# Patient Record
Sex: Male | Born: 1950 | Race: White | Hispanic: No | Marital: Married | Smoking: Former smoker
Health system: Southern US, Community
[De-identification: ages and names within clinical notes are randomized; demographics above are authoritative.]

## PROBLEM LIST (undated history)

## (undated) DIAGNOSIS — Z8619 Personal history of other infectious and parasitic diseases: Secondary | ICD-10-CM

## (undated) DIAGNOSIS — E785 Hyperlipidemia, unspecified: Secondary | ICD-10-CM

## (undated) DIAGNOSIS — I4891 Unspecified atrial fibrillation: Secondary | ICD-10-CM

## (undated) DIAGNOSIS — I10 Essential (primary) hypertension: Secondary | ICD-10-CM

## (undated) HISTORY — DX: Essential (primary) hypertension: I10

## (undated) HISTORY — PX: WISDOM TOOTH EXTRACTION: SHX21

## (undated) HISTORY — PX: HERNIA REPAIR: SHX51

## (undated) HISTORY — DX: Hyperlipidemia, unspecified: E78.5

## (undated) HISTORY — DX: Personal history of other infectious and parasitic diseases: Z86.19

## (undated) HISTORY — DX: Unspecified atrial fibrillation: I48.91

---

## 1979-06-13 HISTORY — PX: APPENDECTOMY: SHX54

## 2003-06-13 DIAGNOSIS — E785 Hyperlipidemia, unspecified: Secondary | ICD-10-CM | POA: Insufficient documentation

## 2006-06-12 HISTORY — PX: SPINAL FUSION: SHX223

## 2006-08-09 DIAGNOSIS — M799 Soft tissue disorder, unspecified: Secondary | ICD-10-CM | POA: Insufficient documentation

## 2006-08-31 ENCOUNTER — Inpatient Hospital Stay (HOSPITAL_COMMUNITY): Admission: RE | Admit: 2006-08-31 | Discharge: 2006-09-01 | Payer: Self-pay | Admitting: Neurosurgery

## 2007-06-20 IMAGING — CR DG CERVICAL SPINE 2 OR 3 VIEWS
1 series · 1 of 1 positions shown · non-contrast
Comparison: None

CLINICAL DATA: C4-C5, C5-C6 ACDF

CERVICAL SPINE - 2 VIEW

[view not recorded]
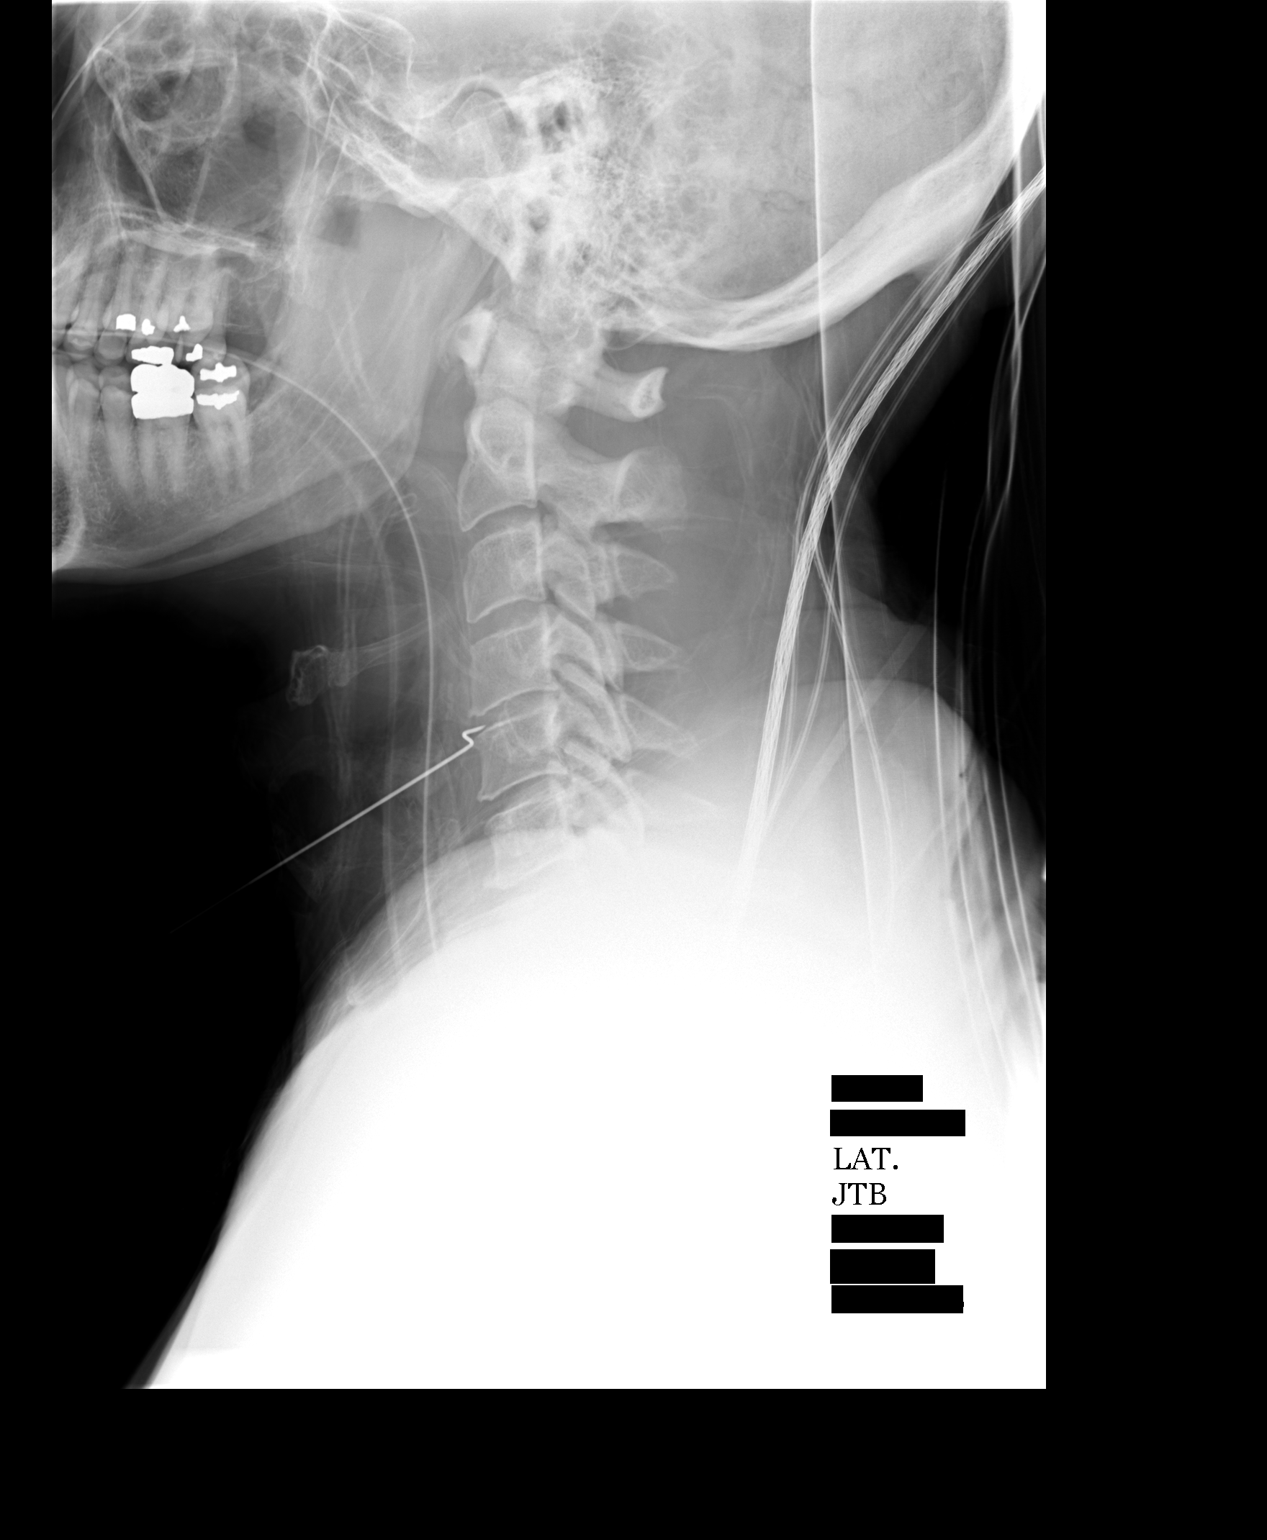

[1 of 1 positions shown; findings below may reference images not displayed]

FINDINGS: First intraoperative lateral film shows an anterior localizing
instrument directed at the C4-C5 level.

Second film shows the patient status post ACDF from C4-C6. Normal alignment.

IMPRESSION

ACDF C4-C6.

## 2008-04-27 DIAGNOSIS — I10 Essential (primary) hypertension: Secondary | ICD-10-CM | POA: Insufficient documentation

## 2008-07-22 ENCOUNTER — Ambulatory Visit: Payer: Self-pay | Admitting: Gastroenterology

## 2008-07-22 LAB — HM COLONOSCOPY

## 2009-10-07 DIAGNOSIS — R7303 Prediabetes: Secondary | ICD-10-CM | POA: Insufficient documentation

## 2009-10-10 DIAGNOSIS — N529 Male erectile dysfunction, unspecified: Secondary | ICD-10-CM | POA: Insufficient documentation

## 2013-10-06 DIAGNOSIS — E785 Hyperlipidemia, unspecified: Secondary | ICD-10-CM | POA: Diagnosis not present

## 2013-10-06 DIAGNOSIS — I10 Essential (primary) hypertension: Secondary | ICD-10-CM | POA: Diagnosis not present

## 2013-10-06 DIAGNOSIS — R7309 Other abnormal glucose: Secondary | ICD-10-CM | POA: Diagnosis not present

## 2013-10-06 DIAGNOSIS — J329 Chronic sinusitis, unspecified: Secondary | ICD-10-CM | POA: Diagnosis not present

## 2013-10-06 DIAGNOSIS — Z23 Encounter for immunization: Secondary | ICD-10-CM | POA: Diagnosis not present

## 2013-10-06 DIAGNOSIS — IMO0002 Reserved for concepts with insufficient information to code with codable children: Secondary | ICD-10-CM | POA: Diagnosis not present

## 2014-02-04 DIAGNOSIS — D235 Other benign neoplasm of skin of trunk: Secondary | ICD-10-CM | POA: Diagnosis not present

## 2014-02-04 DIAGNOSIS — Z85828 Personal history of other malignant neoplasm of skin: Secondary | ICD-10-CM | POA: Diagnosis not present

## 2014-02-04 DIAGNOSIS — L570 Actinic keratosis: Secondary | ICD-10-CM | POA: Diagnosis not present

## 2014-03-26 DIAGNOSIS — Z23 Encounter for immunization: Secondary | ICD-10-CM | POA: Diagnosis not present

## 2014-04-30 DIAGNOSIS — E785 Hyperlipidemia, unspecified: Secondary | ICD-10-CM | POA: Diagnosis not present

## 2014-04-30 DIAGNOSIS — I10 Essential (primary) hypertension: Secondary | ICD-10-CM | POA: Diagnosis not present

## 2014-04-30 DIAGNOSIS — R7309 Other abnormal glucose: Secondary | ICD-10-CM | POA: Diagnosis not present

## 2014-04-30 DIAGNOSIS — Z1389 Encounter for screening for other disorder: Secondary | ICD-10-CM | POA: Diagnosis not present

## 2014-04-30 DIAGNOSIS — Z0000 Encounter for general adult medical examination without abnormal findings: Secondary | ICD-10-CM | POA: Diagnosis not present

## 2014-04-30 DIAGNOSIS — Z125 Encounter for screening for malignant neoplasm of prostate: Secondary | ICD-10-CM | POA: Diagnosis not present

## 2014-05-11 DIAGNOSIS — R7309 Other abnormal glucose: Secondary | ICD-10-CM | POA: Diagnosis not present

## 2014-05-11 DIAGNOSIS — Z125 Encounter for screening for malignant neoplasm of prostate: Secondary | ICD-10-CM | POA: Diagnosis not present

## 2014-05-11 DIAGNOSIS — E785 Hyperlipidemia, unspecified: Secondary | ICD-10-CM | POA: Diagnosis not present

## 2014-05-11 DIAGNOSIS — I10 Essential (primary) hypertension: Secondary | ICD-10-CM | POA: Diagnosis not present

## 2014-05-11 LAB — BASIC METABOLIC PANEL
BUN: 18 mg/dL (ref 4–21)
CREATININE: 0.9 mg/dL (ref 0.6–1.3)
GLUCOSE: 137 mg/dL
Potassium: 4.7 mmol/L (ref 3.4–5.3)
Sodium: 141 mmol/L (ref 137–147)

## 2014-05-11 LAB — PSA: PSA: 2.3

## 2014-05-11 LAB — LIPID PANEL
CHOLESTEROL: 192 mg/dL (ref 0–200)
HDL: 50 mg/dL (ref 35–70)
LDL Cholesterol: 120 mg/dL
Triglycerides: 111 mg/dL (ref 40–160)

## 2014-05-11 LAB — HEMOGLOBIN A1C: HEMOGLOBIN A1C: 6 % (ref 4.0–6.0)

## 2014-12-16 ENCOUNTER — Other Ambulatory Visit: Payer: Self-pay | Admitting: Family Medicine

## 2015-02-15 ENCOUNTER — Other Ambulatory Visit: Payer: Self-pay | Admitting: Family Medicine

## 2015-04-16 ENCOUNTER — Other Ambulatory Visit: Payer: Self-pay | Admitting: *Deleted

## 2015-04-16 MED ORDER — LOSARTAN POTASSIUM-HCTZ 100-12.5 MG PO TABS
1.0000 | ORAL_TABLET | Freq: Every day | ORAL | Status: DC
Start: 2015-04-16 — End: 2015-04-23

## 2015-04-16 MED ORDER — AMLODIPINE BESYLATE 5 MG PO TABS: 5.0000 mg | ORAL_TABLET | Freq: Every day | ORAL | Status: DC

## 2015-04-16 MED ORDER — ATORVASTATIN CALCIUM 20 MG PO TABS: 20.0000 mg | ORAL_TABLET | Freq: Every day | ORAL | Status: DC

## 2015-04-16 NOTE — Telephone Encounter (Signed)
Last ov was 04/30/2014.

## 2015-04-20 ENCOUNTER — Other Ambulatory Visit: Payer: Self-pay | Admitting: Family Medicine

## 2015-04-20 NOTE — Telephone Encounter (Signed)
Pt is requesting 90 day supply sent to [facility] [employer] order because this is what his insurance requires. Pt scheduled f/u appt for 04/28/15. Thanks TNP

## 2015-04-23 ENCOUNTER — Other Ambulatory Visit: Payer: Self-pay | Admitting: Family Medicine

## 2015-04-27 DIAGNOSIS — H9190 Unspecified hearing loss, unspecified ear: Secondary | ICD-10-CM | POA: Insufficient documentation

## 2015-04-28 ENCOUNTER — Other Ambulatory Visit: Payer: Self-pay | Admitting: *Deleted

## 2015-04-28 ENCOUNTER — Ambulatory Visit (INDEPENDENT_AMBULATORY_CARE_PROVIDER_SITE_OTHER): Payer: Medicare Other | Admitting: Family Medicine

## 2015-04-28 ENCOUNTER — Encounter: Payer: Self-pay | Admitting: Family Medicine

## 2015-04-28 VITALS — BP 138/74 | HR 65 | Temp 98.7°F | Resp 16 | Wt 192.0 lb

## 2015-04-28 DIAGNOSIS — E785 Hyperlipidemia, unspecified: Secondary | ICD-10-CM

## 2015-04-28 DIAGNOSIS — Z125 Encounter for screening for malignant neoplasm of prostate: Secondary | ICD-10-CM | POA: Diagnosis not present

## 2015-04-28 DIAGNOSIS — I10 Essential (primary) hypertension: Secondary | ICD-10-CM | POA: Diagnosis not present

## 2015-04-28 DIAGNOSIS — H9193 Unspecified hearing loss, bilateral: Secondary | ICD-10-CM

## 2015-04-28 DIAGNOSIS — R7303 Prediabetes: Secondary | ICD-10-CM | POA: Diagnosis not present

## 2015-04-28 DIAGNOSIS — Z23 Encounter for immunization: Secondary | ICD-10-CM | POA: Diagnosis not present

## 2015-04-28 LAB — POCT GLYCOSYLATED HEMOGLOBIN (HGB A1C)
ESTIMATED AVERAGE GLUCOSE: 123
HEMOGLOBIN A1C: 5.9

## 2015-04-28 NOTE — Progress Notes (Signed)
Patient: Mark Pierce Male    DOB: [birth year]   64 y.o.   MRN: [id number] Visit Date: 04/28/2015  Today's Provider: Lelon Huh, MD   Chief Complaint  Patient presents with  . Follow-up  . Hypertension  . Hyperlipidemia  . Diabetes   Subjective:    HPI  Follow-up for pre-diabetes from 05/01/2015; no changes were made. HgbA1c was 6.0. Has been active and avoid high saturated fat foods.     Hypertension, follow-up:  BP Readings from Last 3 Encounters:  04/28/15 138/74  04/30/14 152/78    He was last seen for hypertension 1 years ago.  BP at that visit was 152/78. Management since that visit includes; none .He reports good compliance with treatment. He is not having side effects. none  He is exercising. He is adherent to low salt diet.   Outside blood pressures are n/a. He is experiencing none.  Patient denies none.   Cardiovascular risk factors include; none.  Use of agents associated with hypertension: none.   ----------------------------------------------------------------------    Lipid/Cholesterol, Follow-up:   Last seen for this 1 years ago.  Management since that visit includes; none.  Last Lipid Panel:    Component Value Date/Time   CHOL 192 05/11/2014   TRIG 111 05/11/2014   HDL 50 05/11/2014   LDLCALC 120 05/11/2014    He reports good compliance with treatment. He is not having side effects. none  Wt Readings from Last 3 Encounters:  04/28/15 192 lb (87.091 kg)  04/30/14 194 lb (87.998 kg)    ----------------------------------------------------------------------    Allergies  Allergen Reactions  . Lisinopril     dry cough   Previous Medications   AMLODIPINE (NORVASC) 5 MG TABLET    Take 1 tablet by mouth  daily   ASCORBIC ACID (VITAMIN C) 500 MG CAPS    Take 1 capsule by mouth daily.   ASPIRIN 81 MG TABLET    Take 1 tablet by mouth daily.   ATORVASTATIN (LIPITOR) 20 MG TABLET    Take 1 tablet by mouth  daily   B  COMPLEX VITAMINS (VITAMIN-B COMPLEX PO)    Take 1 tablet by mouth daily.   CALCIUM-MAGNESIUM-VITAMIN D (CALCIUM 500 PO)    Take 1 tablet by mouth daily.   CHLORPHENIRAMINE MALEATE (ALLERGY PO)    Take by mouth daily.   LOSARTAN-HYDROCHLOROTHIAZIDE (HYZAAR) 100-12.5 MG TABLET    Take 1 tablet by mouth  daily   MULTIPLE VITAMINS-MINERALS (MULTIVITAMIN ADULT PO)    Take 1 tablet by mouth daily.   TADALAFIL (CIALIS) 20 MG TABLET    Take 0.5-1 tablets by mouth daily as needed.   VITAMIN D, CHOLECALCIFEROL, 400 UNITS TABS    Take 1 tablet by mouth daily.    Review of Systems  Constitutional: Negative for fever, chills and appetite change.  Respiratory: Negative for chest tightness, shortness of breath and wheezing.   Cardiovascular: Negative for chest pain and palpitations.  Gastrointestinal: Negative for nausea, vomiting and abdominal pain.    Social History  Substance Use Topics  . Smoking status: Former Smoker -- 1.00 packs/day    Types: Cigarettes    Quit date: 06/12/1998  . Smokeless tobacco: Not on file  . Alcohol Use: 0.0 oz/week    0 Standard drinks or equivalent per week     Comment: moderate use   Objective:   BP 138/74 mmHg  Pulse 65  Temp(Src) 98.7 F (37.1 C) (Oral)  Resp 16  Wt  192 lb (87.091 kg)  SpO2 96%  Physical Exam   General Appearance:    Alert, cooperative, no distress  Eyes:    PERRL, conjunctiva/corneas clear, EOM's intact       Lungs:     Clear to auscultation bilaterally, respirations unlabored  Heart:    Regular rate and rhythm  Neurologic:   Awake, alert, oriented x 3. No apparent focal neurological           defect.         Results for orders placed or performed in visit on 04/28/15  POCT glycosylated hemoglobin (Hb A1C)  Result Value Ref Range   Hemoglobin A1C 5.9    Est. average glucose Bld gHb Est-mCnc 123        Assessment & Plan:     1. Prediabetes Well controlled with diet - POCT glycosylated hemoglobin (Hb A1C) - Comprehensive  metabolic panel  2. Essential (primary) hypertension Well controlled.  Continue current medications.   - Comprehensive metabolic panel  3. Hearing loss, bilateral Doing well since getting hearing aides  4. HLD (hyperlipidemia) He is tolerating atorvastatin well with no adverse effects.   - Lipid panel - Comprehensive metabolic panel  5. Prostate cancer screening  - PSA  6. Need for pneumococcal vaccination  - Pneumococcal polysaccharide vaccine 23-valent greater than or equal to 2yo subcutaneous/IM        Lelon Huh, MD  [facility]

## 2015-04-29 ENCOUNTER — Telehealth: Payer: Self-pay

## 2015-04-29 LAB — COMPREHENSIVE METABOLIC PANEL
A/G RATIO: 1.8 (ref 1.1–2.5)
ALBUMIN: 4.4 g/dL (ref 3.6–4.8)
ALK PHOS: 83 IU/L (ref 39–117)
ALT: 33 IU/L (ref 0–44)
AST: 24 IU/L (ref 0–40)
BILIRUBIN TOTAL: 0.3 mg/dL (ref 0.0–1.2)
BUN / CREAT RATIO: 18 (ref 10–22)
BUN: 16 mg/dL (ref 8–27)
CO2: 25 mmol/L (ref 18–29)
CREATININE: 0.88 mg/dL (ref 0.76–1.27)
Calcium: 9.3 mg/dL (ref 8.6–10.2)
Chloride: 102 mmol/L (ref 97–106)
GFR calc Af Amer: 103 mL/min/{1.73_m2} (ref >59)
GFR calc non Af Amer: 90 mL/min/{1.73_m2} (ref >59)
GLOBULIN, TOTAL: 2.5 g/dL (ref 1.5–4.5)
Glucose: 127 mg/dL — ABNORMAL HIGH (ref 65–99)
POTASSIUM: 4.7 mmol/L (ref 3.5–5.2)
SODIUM: 141 mmol/L (ref 136–144)
Total Protein: 6.9 g/dL (ref 6.0–8.5)

## 2015-04-29 LAB — PSA: PROSTATE SPECIFIC AG, SERUM: 2.7 ng/mL (ref 0.0–4.0)

## 2015-04-29 LAB — LIPID PANEL
CHOLESTEROL TOTAL: 189 mg/dL (ref 100–199)
Chol/HDL Ratio: 3.4 ratio units (ref 0.0–5.0)
HDL: 55 mg/dL (ref >39)
LDL CALC: 117 mg/dL — AB (ref 0–99)
TRIGLYCERIDES: 85 mg/dL (ref 0–149)
VLDL CHOLESTEROL CAL: 17 mg/dL (ref 5–40)

## 2015-04-29 LAB — COMMENT

## 2015-04-29 NOTE — Telephone Encounter (Signed)
-----   Message from Birdie Sons, MD sent at 04/29/2015  7:55 AM EST ----- kidney functions, electrolytes and cholesterol are all normal.  Check labs yearly.

## 2015-04-29 NOTE — Telephone Encounter (Signed)
Advised patient as below.  

## 2015-04-29 NOTE — Telephone Encounter (Signed)
Left message to call back  

## 2016-03-29 ENCOUNTER — Other Ambulatory Visit: Payer: Self-pay | Admitting: Family Medicine

## 2016-03-29 DIAGNOSIS — L820 Inflamed seborrheic keratosis: Secondary | ICD-10-CM | POA: Diagnosis not present

## 2016-03-29 DIAGNOSIS — L578 Other skin changes due to chronic exposure to nonionizing radiation: Secondary | ICD-10-CM | POA: Diagnosis not present

## 2016-03-29 DIAGNOSIS — Z1283 Encounter for screening for malignant neoplasm of skin: Secondary | ICD-10-CM | POA: Diagnosis not present

## 2016-03-29 DIAGNOSIS — Z85828 Personal history of other malignant neoplasm of skin: Secondary | ICD-10-CM | POA: Diagnosis not present

## 2016-03-29 DIAGNOSIS — D229 Melanocytic nevi, unspecified: Secondary | ICD-10-CM | POA: Diagnosis not present

## 2016-04-20 DIAGNOSIS — Z23 Encounter for immunization: Secondary | ICD-10-CM | POA: Diagnosis not present

## 2016-04-21 ENCOUNTER — Telehealth: Payer: Self-pay | Admitting: Family Medicine

## 2016-04-21 NOTE — Telephone Encounter (Signed)
Called Pt to schedule AWV with [facility] - knb °

## 2016-07-07 ENCOUNTER — Other Ambulatory Visit: Payer: Self-pay | Admitting: Family Medicine

## 2016-07-26 ENCOUNTER — Telehealth: Payer: Self-pay | Admitting: Family Medicine

## 2016-07-26 NOTE — Telephone Encounter (Signed)
Called Pt to RE-schedule AWV with [facility] for 9am- knb

## 2016-08-24 ENCOUNTER — Ambulatory Visit: Payer: Medicare Other

## 2016-08-24 ENCOUNTER — Encounter: Payer: Self-pay | Admitting: Family Medicine

## 2016-08-24 ENCOUNTER — Ambulatory Visit (INDEPENDENT_AMBULATORY_CARE_PROVIDER_SITE_OTHER): Payer: Medicare Other | Admitting: Family Medicine

## 2016-08-24 ENCOUNTER — Ambulatory Visit (INDEPENDENT_AMBULATORY_CARE_PROVIDER_SITE_OTHER): Payer: Medicare Other

## 2016-08-24 VITALS — BP 138/68 | HR 64 | Temp 98.8°F | Ht 69.0 in | Wt 194.8 lb

## 2016-08-24 DIAGNOSIS — R7303 Prediabetes: Secondary | ICD-10-CM

## 2016-08-24 DIAGNOSIS — Z0000 Encounter for general adult medical examination without abnormal findings: Secondary | ICD-10-CM

## 2016-08-24 DIAGNOSIS — Z125 Encounter for screening for malignant neoplasm of prostate: Secondary | ICD-10-CM | POA: Diagnosis not present

## 2016-08-24 DIAGNOSIS — Z1159 Encounter for screening for other viral diseases: Secondary | ICD-10-CM | POA: Diagnosis not present

## 2016-08-24 DIAGNOSIS — I10 Essential (primary) hypertension: Secondary | ICD-10-CM

## 2016-08-24 DIAGNOSIS — E7800 Pure hypercholesterolemia, unspecified: Secondary | ICD-10-CM

## 2016-08-24 MED ORDER — TADALAFIL 20 MG PO TABS: 10.0000 mg | ORAL_TABLET | Freq: Every day | ORAL | 12 refills | Status: DC | PRN

## 2016-08-24 NOTE — Progress Notes (Signed)
Patient: Mark Pierce, Male    DOB: November 12, 1950, 66 y.o.   MRN: [id number] Visit Date: 08/24/2016  Today's Provider: Lelon Huh, MD   Chief Complaint  Patient presents with  . Annual Exam  . Hypertension  . Hyperlipidemia   Subjective:    Annual physical exam Mark Pierce is a 66 y.o. male who presents today for health maintenance and complete physical. He feels fairly well. He reports exercising yes/some. He reports he is sleeping fairly well. He had AWV with [facility] this morning. Feels very well.  ----------------------------------------------------------------    Pre-Diabetes, Follow-up:   Lab Results  Component Value Date   HGBA1C 5.9 04/28/2015   HGBA1C 6.0 05/11/2014   Last seen for diabetes 04/28/2015.  Management since then includes; no changes. He reports good compliance with treatment. He is not having side effects. none Current symptoms include none and have been unchanged. Home blood sugar records: n/a  Episodes of hypoglycemia? no   Current Insulin Regimen: n/a Most Recent Eye Exam: n/a Weight trend: stable Prior visit with dietician: no Current diet: in general, a "healthy" diet   Current exercise: walking  ----------------------------------------------------------------   Hypertension, follow-up:  BP Readings from Last 3 Encounters:  08/24/16 138/68  04/28/15 138/74  04/30/14 (!) 152/78    He was last seen for hypertension 04/28/2015.  BP at that visit was 138/74. Management since that visit includes; no changes.He reports good compliance with treatment. He is not having side effects. none He is not exercising, yes He is adherent to low salt diet.   Outside blood pressures are not checking. He is experiencing none.  Patient denies none.   Cardiovascular risk factors include pre-diabetes.  Use of agents associated with hypertension: none.    ----------------------------------------------------------------    Lipid/Cholesterol, Follow-up:   Last seen for this 04/28/2015. Management since that visit includes; no changes.  Last Lipid Panel:    Component Value Date/Time   CHOL 189 04/28/2015 0948   TRIG 85 04/28/2015 0948   HDL 55 04/28/2015 0948   CHOLHDL 3.4 04/28/2015 0948   LDLCALC 117 (H) 04/28/2015 0948    He reports good compliance with treatment. He is not having side effects. none  Wt Readings from Last 3 Encounters:  08/24/16 194 lb 12.8 oz (88.4 kg)  04/28/15 192 lb (87.1 kg)  04/30/14 194 lb (88 kg)    ----------------------------------------------------------------    Review of Systems  Constitutional: Negative for chills, diaphoresis and fever.  HENT: Negative for congestion, ear discharge, ear pain, hearing loss, nosebleeds, sore throat and tinnitus.   Eyes: Negative for photophobia, pain, discharge and redness.  Respiratory: Negative for cough, shortness of breath, wheezing and stridor.   Cardiovascular: Negative for chest pain, palpitations and leg swelling.  Gastrointestinal: Negative for abdominal pain, blood in stool, constipation, diarrhea, nausea and vomiting.  Endocrine: Negative for polydipsia.  Genitourinary: Negative for dysuria, flank pain, frequency, hematuria and urgency.  Musculoskeletal: Negative for back pain, myalgias and neck pain.  Skin: Negative for rash.  Allergic/Immunologic: Negative for environmental allergies.  Neurological: Negative for dizziness, tremors, seizures, weakness and headaches.  Hematological: Does not bruise/bleed easily.  Psychiatric/Behavioral: Negative for hallucinations and suicidal ideas. The patient is not nervous/anxious.   All other systems reviewed and are negative.   Social History      He  reports that he quit smoking about 18 years ago. His smoking use included Cigarettes. He smoked 1.00 pack per day. He has never used smokeless  tobacco.  He reports that he drinks alcohol. He reports that he does not use drugs.       Social History   Social History  . Marital status: Married    Spouse name: N/A  . Number of children: 2  . Years of education: N/A   Occupational History  . Retired     [employer] was a Museum/gallery curator. Retired in 2015   Social History Main Topics  . Smoking status: Former Smoker    Packs/day: 1.00    Types: Cigarettes    Quit date: 06/12/1998  . Smokeless tobacco: Never Used  . Alcohol use 0.0 oz/week     Comment: moderate use  . Drug use: No  . Sexual activity: Not on file   Other Topics Concern  . Not on file   Social History Narrative  . No narrative on file    Past Medical History:  Diagnosis Date  . History of chicken pox      Patient Active Problem List   Diagnosis Date Noted  . Hearing loss 04/27/2015  . 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 Prediabetes 10/07/2009  . Essential (primary) hypertension 04/27/2008  . HLD (hyperlipidemia) 06/13/2003    Past Surgical History:  Procedure Laterality Date  . APPENDECTOMY  1981  . SPINAL FUSION  2008   patient stated that he had a cervical spine infusion     Family History        Family Status  Relation Status  . Mother Alive   has colorectal adenoma  . Father Deceased   also had Colorectal Adenoma and had Bypass  . Brother Alive  . Son Alive  . Son Alive        His family history includes CAD in his father; Colitis in his father; Hyperlipidemia in his brother; Mitral valve prolapse in his brother.     Allergies  Allergen Reactions  . Lisinopril     dry cough     Current Outpatient Prescriptions:  .  amLODipine (NORVASC) 5 MG tablet, TAKE 1 TABLET BY MOUTH  DAILY, Disp: 90 tablet, Rfl: 3 .  Ascorbic Acid (VITAMIN C) 500 MG CAPS, Take 1 capsule by mouth daily., Disp: , Rfl:  .  aspirin 81 MG tablet, Take 1 tablet by mouth daily., Disp: , Rfl:  .  atorvastatin (LIPITOR) 20 MG tablet, TAKE 1 TABLET  BY MOUTH  DAILY, Disp: 90 tablet, Rfl: 1 .  B Complex Vitamins (VITAMIN-B COMPLEX PO), Take 1 tablet by mouth daily., Disp: , Rfl:  .  Calcium-Magnesium-Vitamin D (CALCIUM 500 PO), Take 1 tablet by mouth daily., Disp: , Rfl:  .  Chlorpheniramine Maleate (ALLERGY PO), Take by mouth daily., Disp: , Rfl:  .  losartan-hydrochlorothiazide (HYZAAR) 100-12.5 MG tablet, TAKE 1 TABLET BY MOUTH  DAILY, Disp: 90 tablet, Rfl: 1 .  Multiple Vitamins-Minerals (MULTIVITAMIN ADULT PO), Take 1 tablet by mouth daily., Disp: , Rfl:  .  tadalafil (CIALIS) 20 MG tablet, Take 0.5-1 tablets by mouth daily as needed., Disp: , Rfl:  .  Vitamin D, Cholecalciferol, 400 UNITS TABS, Take 1 tablet by mouth daily., Disp: , Rfl:    Patient Care Team: Birdie Sons, MD as PCP - General (Family Medicine) Lucilla Lame, MD (Gastroenterology)      Objective:   Vitals: BP 138/68 (BP Location: Right Arm)   Pulse 64   Temp 98.8 F (37.1 C) (Oral)   Ht 5\' 9"  (1.753 m)   Wt 194 lb  12.8 oz (88.4 kg)   BMI 28.77 kg/m   Body mass index is 28.77 kg/m.    Physical Exam   General Appearance:    Alert, cooperative, no distress, appears stated age  Head:    Normocephalic, without obvious abnormality, atraumatic  Eyes:    PERRL, conjunctiva/corneas clear, EOM's intact, fundi    benign, both eyes       Ears:    Normal TM's and external ear canals, both ears  Nose:   Nares normal, septum midline, mucosa normal, no drainage   or sinus tenderness  Throat:   Lips, mucosa, and tongue normal; teeth and gums normal  Neck:   Supple, symmetrical, trachea midline, no adenopathy;       thyroid:  No enlargement/tenderness/nodules; no carotid   bruit or JVD  Back:     Symmetric, no curvature, ROM normal, no CVA tenderness  Lungs:     Clear to auscultation bilaterally, respirations unlabored  Chest wall:    No tenderness or deformity  Heart:    Regular rate and rhythm, S1 and S2 normal, no murmur, rub   or gallop  Abdomen:     Soft,  non-tender, bowel sounds active all four quadrants,    no masses, no organomegaly  Genitalia:    deferred  Rectal:    deferred  Extremities:   Extremities normal, atraumatic, no cyanosis or edema  Pulses:   2+ and symmetric all extremities  Skin:   Skin color, texture, turgor normal, no rashes or lesions  Lymph nodes:   Cervical, supraclavicular, and axillary nodes normal  Neurologic:   CNII-XII intact. Normal strength, sensation and reflexes      throughout    Depression Screen PHQ 2/9 Scores 04/28/2015  PHQ - 2 Score 0  PHQ- 9 Score 0      Assessment & Plan:     Routine Health Maintenance and Physical Exam  Exercise Activities and Dietary recommendations Goals    None      Immunization History  Administered Date(s) Administered  . Influenza-Unspecified 04/13/2015  . Pneumococcal Conjugate-13 10/06/2013  . Pneumococcal Polysaccharide-23 04/28/2015  . Td 06/02/2003  . Zoster 03/08/2013    Health Maintenance  Topic Date Due  . Hepatitis C Screening  04-22-1951  . TETANUS/TDAP  06/01/2013  . INFLUENZA VACCINE  01/11/2016  . COLONOSCOPY  07/22/2018  . PNA vac Low Risk Adult  Completed     Discussed health benefits of physical activity, and encouraged him to engage in regular exercise appropriate for his age and condition.    --------------------------------------------------------------------  1. Annual physical exam Doing well with normal exam today. He declined tetanus vaccine.  - Lipid panel - Comprehensive metabolic panel  2. Prediabetes  - Hemoglobin A1c  3. Essential (primary) hypertension Well controlled.  Continue current medications.   - EKG 12-Lead  4. Pure hypercholesterolemia He is tolerating atorvastatin well with no adverse effects.   - Lipid panel - Comprehensive metabolic panel  5. Prostate cancer screening  - PSA  6. Need for hepatitis C screening test  - Hepatitis C antibody   Lelon Huh, MD  [facility]

## 2016-08-24 NOTE — Patient Instructions (Signed)
Mark Pierce , Thank you for taking time to come for your Medicare Wellness Visit. I appreciate your ongoing commitment to your health goals. Please review the following plan we discussed and let me know if I can assist you in the future.   Screening recommendations/referrals: Colonoscopy completed 07/22/2008 Recommended yearly ophthalmology/optometry visit for glaucoma screening and checkup Recommended yearly dental visit for hygiene and checkup  Vaccinations: Influenza vaccine - done Pneumococcal vaccine - done Tdap vaccine - denied Shingles vaccine done    Advanced directives: done and will bring a copy to office  Conditions/risks identified: N/A  Next appointment: today @ 9:30 with Dr Caryn Section  Preventive Care 31 Years and Older, Male Preventive care refers to lifestyle choices and visits with your health care provider that can promote health and wellness. What does preventive care include?  A yearly physical exam. This is also called an annual well check.  Dental exams once or twice a year.  Routine eye exams. Ask your health care provider how often you should have your eyes checked.  Personal lifestyle choices, including:  Daily care of your teeth and gums.  Regular physical activity.  Eating a healthy diet.  Avoiding tobacco and drug use.  Limiting alcohol use.  Practicing safe sex.  Taking low doses of aspirin every day.  Taking vitamin and mineral supplements as recommended by your health care provider. What happens during an annual well check? The services and screenings done by your health care provider during your annual well check will depend on your age, overall health, lifestyle risk factors, and family history of disease. Counseling  Your health care provider may ask you questions about your:  Alcohol use.  Tobacco use.  Drug use.  Emotional well-being.  Home and relationship well-being.  Sexual activity.  Eating habits.  History of  falls.  Memory and ability to understand (cognition).  Work and work Statistician. Screening  You may have the following tests or measurements:  Height, weight, and BMI.  Blood pressure.  Lipid and cholesterol levels. These may be checked every 5 years, or more frequently if you are over 34 years old.  Skin check.  Lung cancer screening. You may have this screening every year starting at age 19 if you have a 30-pack-year history of smoking and currently smoke or have quit within the past 15 years.  Fecal occult blood test (FOBT) of the stool. You may have this test every year starting at age 44.  Flexible sigmoidoscopy or colonoscopy. You may have a sigmoidoscopy every 5 years or a colonoscopy every 10 years starting at age 42.  Prostate cancer screening. Recommendations will vary depending on your family history and other risks.  Hepatitis C blood test.  Hepatitis B blood test.  Sexually transmitted disease (STD) testing.  Diabetes screening. This is done by checking your blood sugar (glucose) after you have not eaten for a while (fasting). You may have this done every 1-3 years.  Abdominal aortic aneurysm (AAA) screening. You may need this if you are a current or former smoker.  Osteoporosis. You may be screened starting at age 49 if you are at high risk. Talk with your health care provider about your test results, treatment options, and if necessary, the need for more tests. Vaccines  Your health care provider may recommend certain vaccines, such as:  Influenza vaccine. This is recommended every year.  Tetanus, diphtheria, and acellular pertussis (Tdap, Td) vaccine. You may need a Td booster every 10 years.  Zoster  vaccine. You may need this after age 23.  Pneumococcal 13-valent conjugate (PCV13) vaccine. One dose is recommended after age 41.  Pneumococcal polysaccharide (PPSV23) vaccine. One dose is recommended after age 68. Talk to your health care provider about  which screenings and vaccines you need and how often you need them. This information is not intended to replace advice given to you by your health care provider. Make sure you discuss any questions you have with your health care provider. Document Released: 06/25/2015 Document Revised: 02/16/2016 Document Reviewed: 03/30/2015 Elsevier Interactive Patient Education  2017 [employer] Prevention in the Home Falls can cause injuries. They can happen to people of all ages. There are many things you can do to make your home safe and to help prevent falls. What can I do on the outside of my home?  Regularly fix the edges of walkways and driveways and fix any cracks.  Remove anything that might make you trip as you walk through a door, such as a raised step or threshold.  Trim any bushes or trees on the path to your home.  Use bright outdoor lighting.  Clear any walking paths of anything that might make someone trip, such as rocks or tools.  Regularly check to see if handrails are loose or broken. Make sure that both sides of any steps have handrails.  Any raised decks and porches should have guardrails on the edges.  Have any leaves, snow, or ice cleared regularly.  Use sand or salt on walking paths during winter.  Clean up any spills in your garage right away. This includes oil or grease spills. What can I do in the bathroom?  Use night lights.  Install grab bars by the toilet and in the tub and shower. Do not use towel bars as grab bars.  Use non-skid mats or decals in the tub or shower.  If you need to sit down in the shower, use a plastic, non-slip stool.  Keep the floor dry. Clean up any water that spills on the floor as soon as it happens.  Remove soap buildup in the tub or shower regularly.  Attach bath mats securely with double-sided non-slip rug tape.  Do not have throw rugs and other things on the floor that can make you trip. What can I do in the  bedroom?  Use night lights.  Make sure that you have a light by your bed that is easy to reach.  Do not use any sheets or blankets that are too big for your bed. They should not hang down onto the floor.  Have a firm chair that has side arms. You can use this for support while you get dressed.  Do not have throw rugs and other things on the floor that can make you trip. What can I do in the kitchen?  Clean up any spills right away.  Avoid walking on wet floors.  Keep items that you use a lot in easy-to-reach places.  If you need to reach something above you, use a strong step stool that has a grab bar.  Keep electrical cords out of the way.  Do not use floor polish or wax that makes floors slippery. If you must use wax, use non-skid floor wax.  Do not have throw rugs and other things on the floor that can make you trip. What can I do with my stairs?  Do not leave any items on the stairs.  Make sure that there are handrails  on both sides of the stairs and use them. Fix handrails that are broken or loose. Make sure that handrails are as long as the stairways.  Check any carpeting to make sure that it is firmly attached to the stairs. Fix any carpet that is loose or worn.  Avoid having throw rugs at the top or bottom of the stairs. If you do have throw rugs, attach them to the floor with carpet tape.  Make sure that you have a light switch at the top of the stairs and the bottom of the stairs. If you do not have them, ask someone to add them for you. What else can I do to help prevent falls?  Wear shoes that:  Do not have high heels.  Have rubber bottoms.  Are comfortable and fit you well.  Are closed at the toe. Do not wear sandals.  If you use a stepladder:  Make sure that it is fully opened. Do not climb a closed stepladder.  Make sure that both sides of the stepladder are locked into place.  Ask someone to hold it for you, if possible.  Clearly mark and make  sure that you can see:  Any grab bars or handrails.  First and last steps.  Where the edge of each step is.  Use tools that help you move around (mobility aids) if they are needed. These include:  Canes.  Walkers.  Scooters.  Crutches.  Turn on the lights when you go into a dark area. Replace any light bulbs as soon as they burn out.  Set up your furniture so you have a clear path. Avoid moving your furniture around.  If any of your floors are uneven, fix them.  If there are any pets around you, be aware of where they are.  Review your medicines with your doctor. Some medicines can make you feel dizzy. This can increase your chance of falling. Ask your doctor what other things that you can do to help prevent falls. This information is not intended to replace advice given to you by your health care provider. Make sure you discuss any questions you have with your health care provider. Document Released: 03/25/2009 Document Revised: 11/04/2015 Document Reviewed: 07/03/2014 Elsevier Interactive Patient Education  2017 [employer].

## 2016-08-24 NOTE — Progress Notes (Signed)
Subjective:   Mark Pierce is a 66 y.o. male who presents for Medicare Annual/Subsequent preventive examination.  Review of Systems:  N/A  Cardiac Risk Factors include: advanced age (>19men, >55 women);dyslipidemia;hypertension;male gender     Objective:    Vitals: BP 138/68 (BP Location: Right Arm)   Pulse 64   Temp 98.8 F (37.1 C) (Oral)   Ht 5\' 9"  (1.753 m)   Wt 194 lb 12.8 oz (88.4 kg)   BMI 28.77 kg/m   Body mass index is 28.77 kg/m.  Tobacco History  Smoking Status  . Former Smoker  . Packs/day: 1.00  . Types: Cigarettes  . Quit date: 06/12/1998  Smokeless Tobacco  . Never Used     Counseling given: Not Answered   Past Medical History:  Diagnosis Date  . History of chicken pox   . Hyperlipidemia   . Hypertension    Past Surgical History:  Procedure Laterality Date  . APPENDECTOMY  1981  . SPINAL FUSION  2008   patient stated that he had a cervical spine infusion    Family History  Problem Relation Age of Onset  . CAD Father   . Colitis Father   . Hyperlipidemia Brother   . Mitral valve prolapse Brother    History  Sexual Activity  . Sexual activity: Not on file    Outpatient Encounter Prescriptions as of 08/24/2016  Medication Sig  . amLODipine (NORVASC) 5 MG tablet TAKE 1 TABLET BY MOUTH  DAILY  . aspirin 81 MG tablet Take 1 tablet by mouth daily.  Marland Kitchen atorvastatin (LIPITOR) 20 MG tablet TAKE 1 TABLET BY MOUTH  DAILY  . Chlorpheniramine Maleate (ALLERGY PO) Take by mouth daily.  Marland Kitchen losartan-hydrochlorothiazide (HYZAAR) 100-12.5 MG tablet TAKE 1 TABLET BY MOUTH  DAILY  . Multiple Vitamins-Minerals (MULTIVITAMIN ADULT PO) Take 1 tablet by mouth daily.  . Ascorbic Acid (VITAMIN C) 500 MG CAPS Take 1 capsule by mouth daily.  . B Complex Vitamins (VITAMIN-B COMPLEX PO) Take 1 tablet by mouth daily.  . Calcium-Magnesium-Vitamin D (CALCIUM 500 PO) Take 1 tablet by mouth daily.  . tadalafil (CIALIS) 20 MG tablet Take 0.5-1 tablets by mouth daily  as needed.  . Vitamin D, Cholecalciferol, 400 UNITS TABS Take 1 tablet by mouth daily.   No facility-administered encounter medications on file as of 08/24/2016.     Activities of Daily Living In your present state of health, do you have any difficulty performing the following activities: 08/24/2016  Hearing? N  Vision? N  Difficulty concentrating or making decisions? N  Walking or climbing stairs? N  Dressing or bathing? N  Doing errands, shopping? N  Preparing Food and eating ? N  Using the Toilet? N  In the past six months, have you accidently leaked urine? N  Do you have problems with loss of bowel control? N  Managing your Medications? N  Managing your Finances? N  Housekeeping or managing your Housekeeping? N  Some recent data might be hidden    Patient Care Team: Birdie Sons, MD as PCP - General (Family Medicine) Lucilla Lame, MD (Gastroenterology)   Assessment:    Exercise Activities and Dietary recommendations Current Exercise Habits: Structured exercise class, Type of exercise: Other - see comments;stretching;strength training/weights;walking;treadmill (golf twice a week, fishing 60 days a year), Time (Minutes): > 60, Frequency (Times/Week): 4, Weekly Exercise (Minutes/Week): 0, Intensity: Mild, Exercise limited by: None identified  Goals    . Increase water intake  Recommend increasing water intake to 3-4 glasses a day.      Fall Risk Fall Risk  08/24/2016  Falls in the past year? No   Depression Screen PHQ 2/9 Scores 08/24/2016 04/28/2015  PHQ - 2 Score 0 0  PHQ- 9 Score 0 0    Cognitive Function        Immunization History  Administered Date(s) Administered  . Influenza-Unspecified 04/13/2015  . Pneumococcal Conjugate-13 10/06/2013  . Pneumococcal Polysaccharide-23 04/28/2015  . Td 06/02/2003  . Zoster 03/08/2013   Screening Tests Health Maintenance  Topic Date Due  . Hepatitis C Screening  01-Jul-1950  . TETANUS/TDAP  06/01/2013    . INFLUENZA VACCINE  01/11/2016  . COLONOSCOPY  07/22/2018  . PNA vac Low Risk Adult  Completed      Plan:  I have personally reviewed and addressed the Medicare Annual Wellness questionnaire and have noted the following in the patient's chart:  A. Medical and social history B. Use of alcohol, tobacco or illicit drugs  C. Current medications and supplements D. Functional ability and status E.  Nutritional status F.  Physical activity G. Advance directives H. List of other physicians I.  Hospitalizations, surgeries, and ER visits in previous 12 months J.  [place] such as hearing and vision if needed, cognitive and depression L. Referrals and appointments - none  In addition, I have reviewed and discussed with patient certain preventive protocols, quality metrics, and best practice recommendations. A written personalized care plan for preventive services as well as general preventive health recommendations were provided to patient.  See attached scanned questionnaire for additional information.   Signed,  Fabio Neighbors, LPN Nurse Health Advisor  I have reviewed the health advisor's note, was available for consultation, and agree with documentation and plan  Lelon Huh, MD  MD Recommendations: None. Pt declined tetanus today.

## 2016-08-25 LAB — HEMOGLOBIN A1C
Est. average glucose Bld gHb Est-mCnc: 126 mg/dL
HEMOGLOBIN A1C: 6 % — AB (ref 4.8–5.6)

## 2016-08-25 LAB — COMPREHENSIVE METABOLIC PANEL
A/G RATIO: 1.8 (ref 1.2–2.2)
ALBUMIN: 4.7 g/dL (ref 3.6–4.8)
ALK PHOS: 97 IU/L (ref 39–117)
ALT: 43 IU/L (ref 0–44)
AST: 25 IU/L (ref 0–40)
BILIRUBIN TOTAL: 0.3 mg/dL (ref 0.0–1.2)
BUN / CREAT RATIO: 24 (ref 10–24)
BUN: 21 mg/dL (ref 8–27)
CHLORIDE: 99 mmol/L (ref 96–106)
CO2: 26 mmol/L (ref 18–29)
Calcium: 9.8 mg/dL (ref 8.6–10.2)
Creatinine, Ser: 0.89 mg/dL (ref 0.76–1.27)
GFR calc Af Amer: 102 mL/min/{1.73_m2} (ref >59)
GFR calc non Af Amer: 88 mL/min/{1.73_m2} (ref >59)
GLOBULIN, TOTAL: 2.6 g/dL (ref 1.5–4.5)
GLUCOSE: 139 mg/dL — AB (ref 65–99)
Potassium: 4.2 mmol/L (ref 3.5–5.2)
SODIUM: 141 mmol/L (ref 134–144)
Total Protein: 7.3 g/dL (ref 6.0–8.5)

## 2016-08-25 LAB — HEPATITIS C ANTIBODY: Hep C Virus Ab: 0.1 s/co ratio (ref 0.0–0.9)

## 2016-08-25 LAB — LIPID PANEL
CHOLESTEROL TOTAL: 199 mg/dL (ref 100–199)
Chol/HDL Ratio: 4.5 ratio units (ref 0.0–5.0)
HDL: 44 mg/dL (ref >39)
LDL Calculated: 122 mg/dL — ABNORMAL HIGH (ref 0–99)
TRIGLYCERIDES: 165 mg/dL — AB (ref 0–149)
VLDL CHOLESTEROL CAL: 33 mg/dL (ref 5–40)

## 2016-08-25 LAB — PSA: Prostate Specific Ag, Serum: 3.5 ng/mL (ref 0.0–4.0)

## 2016-09-27 DIAGNOSIS — H2511 Age-related nuclear cataract, right eye: Secondary | ICD-10-CM | POA: Diagnosis not present

## 2016-09-28 DIAGNOSIS — L578 Other skin changes due to chronic exposure to nonionizing radiation: Secondary | ICD-10-CM | POA: Diagnosis not present

## 2016-09-28 DIAGNOSIS — L812 Freckles: Secondary | ICD-10-CM | POA: Diagnosis not present

## 2016-09-28 DIAGNOSIS — L570 Actinic keratosis: Secondary | ICD-10-CM | POA: Diagnosis not present

## 2016-09-28 DIAGNOSIS — L821 Other seborrheic keratosis: Secondary | ICD-10-CM | POA: Diagnosis not present

## 2016-09-28 DIAGNOSIS — L820 Inflamed seborrheic keratosis: Secondary | ICD-10-CM | POA: Diagnosis not present

## 2016-11-05 ENCOUNTER — Encounter: Payer: Self-pay | Admitting: Family Medicine

## 2016-11-07 MED ORDER — SILDENAFIL CITRATE 20 MG PO TABS: ORAL_TABLET | ORAL | 6 refills | Status: DC

## 2016-11-09 ENCOUNTER — Other Ambulatory Visit: Payer: Self-pay | Admitting: Family Medicine

## 2016-11-09 MED ORDER — SILDENAFIL CITRATE 100 MG PO TABS: 100.0000 mg | ORAL_TABLET | Freq: Every day | ORAL | 4 refills | Status: DC | PRN

## 2016-12-05 DIAGNOSIS — Z743 Need for continuous supervision: Secondary | ICD-10-CM | POA: Diagnosis not present

## 2016-12-05 DIAGNOSIS — R42 Dizziness and giddiness: Secondary | ICD-10-CM | POA: Diagnosis not present

## 2016-12-05 DIAGNOSIS — Z79899 Other long term (current) drug therapy: Secondary | ICD-10-CM | POA: Diagnosis not present

## 2016-12-05 DIAGNOSIS — I10 Essential (primary) hypertension: Secondary | ICD-10-CM | POA: Diagnosis not present

## 2016-12-05 DIAGNOSIS — Z87891 Personal history of nicotine dependence: Secondary | ICD-10-CM | POA: Diagnosis not present

## 2016-12-05 DIAGNOSIS — R55 Syncope and collapse: Secondary | ICD-10-CM | POA: Diagnosis not present

## 2016-12-26 ENCOUNTER — Other Ambulatory Visit: Payer: Self-pay | Admitting: Family Medicine

## 2017-03-21 ENCOUNTER — Other Ambulatory Visit: Payer: Self-pay | Admitting: Family Medicine

## 2017-07-30 DIAGNOSIS — L570 Actinic keratosis: Secondary | ICD-10-CM | POA: Diagnosis not present

## 2017-07-30 DIAGNOSIS — L820 Inflamed seborrheic keratosis: Secondary | ICD-10-CM | POA: Diagnosis not present

## 2017-07-30 DIAGNOSIS — D225 Melanocytic nevi of trunk: Secondary | ICD-10-CM | POA: Diagnosis not present

## 2017-07-30 DIAGNOSIS — D2230 Melanocytic nevi of unspecified part of face: Secondary | ICD-10-CM | POA: Diagnosis not present

## 2017-07-30 DIAGNOSIS — Z1283 Encounter for screening for malignant neoplasm of skin: Secondary | ICD-10-CM | POA: Diagnosis not present

## 2017-08-01 ENCOUNTER — Telehealth: Payer: Self-pay

## 2017-08-01 NOTE — Telephone Encounter (Signed)
LMTCB and schedule AWV with [facility]. -MM

## 2017-08-14 NOTE — Telephone Encounter (Signed)
LMTCB to schedule AWV. -MM 

## 2017-08-28 ENCOUNTER — Telehealth: Payer: Self-pay | Admitting: Family Medicine

## 2017-08-28 NOTE — Telephone Encounter (Signed)
Need more information. What where their friends symptoms, did anyone have positive flu test?

## 2017-08-28 NOTE — Telephone Encounter (Signed)
Patient states that he went away with friends over the weekend and five out of six have gotten sick with the flu and he is wanting to know if there is anything that he can do to prevent getting the flu.  Another friend that went was given tamiflu to prevent it.  He states that he and his wife feels fine so far.  He uses Public house manager in [place].

## 2017-08-28 NOTE — Telephone Encounter (Signed)
Appointment made for 09/06/2017 at 8:40am

## 2017-08-28 NOTE — Telephone Encounter (Signed)
Please advise 

## 2017-08-28 NOTE — Telephone Encounter (Signed)
LMOVM for pt to return call 

## 2017-08-29 NOTE — Telephone Encounter (Signed)
Patient stated one of his friends had pneumonia and 1 other friend was informed by a physician they had the flu. Patient stated that he does not have any symptoms so far and that he will let us know if that changes. Patient stated to just disregard previous message.

## 2017-09-06 ENCOUNTER — Ambulatory Visit (INDEPENDENT_AMBULATORY_CARE_PROVIDER_SITE_OTHER): Payer: Medicare Other

## 2017-09-06 VITALS — BP 134/72 | HR 64 | Temp 97.8°F | Ht 69.0 in | Wt 188.0 lb

## 2017-09-06 DIAGNOSIS — Z0000 Encounter for general adult medical examination without abnormal findings: Secondary | ICD-10-CM

## 2017-09-06 NOTE — Progress Notes (Addendum)
Subjective:   Mark Pierce is a 67 y.o. male who presents for Medicare Annual/Subsequent preventive examination.  Review of Systems:  N/A  Cardiac Risk Factors include: advanced age (>23men, >70 women);dyslipidemia;hypertension;male gender;sedentary lifestyle     Objective:    Vitals: BP 134/72 (BP Location: Left Arm)   Pulse 64   Temp 97.8 F (36.6 C) (Oral)   Ht 5\' 9"  (1.753 m)   Wt 188 lb (85.3 kg)   BMI 27.76 kg/m   Body mass index is 27.76 kg/m.  Advanced Directives 09/06/2017 08/24/2016  Does Patient Have a Medical Advance Directive? Yes Yes  Type of Paramedic of Mark Pierce  Copy of [place] in Chart? No - copy requested No - copy requested    Tobacco Social History   Tobacco Use  Smoking Status Former Smoker  . Packs/day: 1.00  . Types: Cigarettes  . Last attempt to quit: 06/12/1998  . Years since quitting: 19.2  Smokeless Tobacco Never Used     Counseling given: Not Answered   Clinical Intake:  Pre-visit preparation completed: Yes  Pain : No/denies pain Pain Score: 0-No pain     Nutritional Status: BMI 25 -29 Overweight Nutritional Risks: None Diabetes: No  How often do you need to have someone help you when you read instructions, pamphlets, or other written materials from your doctor or pharmacy?: 1 - Never  Interpreter Needed?: No  Information entered by :: [facility], LPN  Past Medical History:  Diagnosis Date  . History of chicken pox   . Hyperlipidemia   . Hypertension    Past Surgical History:  Procedure Laterality Date  . APPENDECTOMY  1981  . SPINAL FUSION  2008   patient stated that he had a cervical spine infusion    Family History  Problem Relation Age of Onset  . CAD Father   . Colitis Father   . Hyperlipidemia Brother   . Mitral valve prolapse Brother    Social History   Socioeconomic History  . Marital status: Married     Spouse name: Not on file  . Number of children: 2  . Years of education: Not on file  . Highest education level: Bachelor's degree (e.g., BA, AB, BS)  Occupational History  . 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 Financial resource strain: Not hard at all  . Food insecurity:    Worry: Never true    Inability: Never true  . Transportation needs:    Medical: No    Non-medical: No  Tobacco Use  . Smoking status: Former Smoker    Packs/day: 1.00    Types: Cigarettes    Last attempt to quit: 06/12/1998    Years since quitting: 19.2  . Smokeless tobacco: Never Used  Substance and Sexual Activity  . Alcohol use: Yes    Alcohol/week: 12.6 oz    Types: 7 Glasses of wine, 10 Shots of liquor, 4 Cans of beer per week    Comment: moderate use  . Drug use: No  . Sexual activity: Not on file  Lifestyle  . Physical activity:    Days per week: Not on file    Minutes per session: Not on file  . Stress: Not at all  Relationships  . Social connections:    Talks on phone: Not on file    Gets together: Not on file  Attends religious service: Not on file    Active member of club or organization: Not on file    Attends meetings of clubs or organizations: Not on file    Relationship status: Not on file  Other Topics Concern  . Not on file  Social History Narrative  . Not on file    Outpatient Encounter Medications as of 09/06/2017  Medication Sig  . amLODipine (NORVASC) 5 MG tablet TAKE 1 TABLET BY MOUTH  DAILY  . aspirin 81 MG tablet Take 1 tablet by mouth daily.  Marland Kitchen atorvastatin (LIPITOR) 20 MG tablet TAKE 1 TABLET BY MOUTH  DAILY  . Chlorpheniramine Maleate (ALLERGY PO) Take by mouth daily.  Marland Kitchen losartan-hydrochlorothiazide (HYZAAR) 100-12.5 MG tablet TAKE 1 TABLET BY MOUTH  DAILY  . Multiple Vitamins-Minerals (MULTIVITAMIN ADULT PO) Take 1 tablet by mouth daily.  . sildenafil (REVATIO) 20 MG tablet 3-5 tablets as needed  prior to intercourse, not to exceed 5 tablets in a day  . sildenafil (VIAGRA) 100 MG tablet Take 1 tablet (100 mg total) by mouth daily as needed for erectile dysfunction.  . tadalafil (CIALIS) 20 MG tablet Take 0.5-1 tablets (10-20 mg total) by mouth daily as needed.  . Ascorbic Acid (VITAMIN C) 500 MG CAPS Take 1 capsule by mouth daily.  . B Complex Vitamins (VITAMIN-B COMPLEX PO) Take 1 tablet by mouth daily.  . Calcium-Magnesium-Vitamin D (CALCIUM 500 PO) Take 1 tablet by mouth daily.  . Vitamin D, Cholecalciferol, 400 UNITS TABS Take 1 tablet by mouth daily.   No facility-administered encounter medications on file as of 09/06/2017.     Activities of Daily Living In your present state of health, do you have any difficulty performing the following activities: 09/06/2017  Hearing? N  Vision? N  Difficulty concentrating or making decisions? N  Walking or climbing stairs? N  Dressing or bathing? N  Doing errands, shopping? N  Preparing Food and eating ? N  Using the Toilet? N  In the past six months, have you accidently leaked urine? N  Do you have problems with loss of bowel control? N  Managing your Medications? N  Managing your Finances? N  Housekeeping or managing your Housekeeping? N  Some recent data might be hidden    Patient Care Team: Birdie Sons, MD as PCP - General (Family Medicine) Birder Robson, MD as Referring Physician (Ophthalmology)   Assessment:   This is a routine wellness examination for Mark Pierce.  Exercise Activities and Dietary recommendations Current Exercise Habits: The patient does not participate in regular exercise at present, Exercise limited by: None identified  Goals    . Exercise 3x per week (30 min per time)     Recommend to start back going to the gym 3 days a week for at least 30 minutes.        Fall Risk Fall Risk  09/06/2017 08/24/2016  Falls in the past year? No No   Is the patient's home free of loose throw rugs in walkways, pet  beds, electrical cords, etc?   yes      Grab bars in the bathroom? yes      Handrails on the stairs?   yes      Adequate lighting?   yes  Timed Get Up and Go Performed: N/A  Depression Screen PHQ 2/9 Scores 09/06/2017 09/06/2017 08/24/2016 04/28/2015  PHQ - 2 Score 0 0 0 0  PHQ- 9 Score 0 - 0 0    Cognitive Function: Pt declined  screening today.      6CIT Screen 08/24/2016  What Year? 0 points  What month? 0 points  What time? 0 points  Count back from 20 0 points  Months in reverse 0 points  Repeat phrase 0 points  Total Score 0    Immunization History  Administered Date(s) Administered  . H1N1 04/27/2008  . Influenza, High Dose Seasonal PF 03/11/2017  . Influenza-Unspecified 04/13/2015  . Pneumococcal Conjugate-13 10/06/2013  . Pneumococcal Polysaccharide-23 04/28/2015  . Td 06/02/2003  . Zoster 03/08/2013    Qualifies for Shingles Vaccine?  Due for Shingles vaccine. Declined my offer to administer today. Education has been provided regarding the importance of this vaccine. Pt has been advised to call her insurance company to determine her out of pocket expense. Advised she may also receive this vaccine at her local pharmacy or Health Dept. Verbalized acceptance and understanding.  Screening Tests Health Maintenance  Topic Date Due  . TETANUS/TDAP  06/12/2026 (Originally 06/01/2013)  . COLONOSCOPY  07/22/2018  . INFLUENZA VACCINE  Completed  . Hepatitis C Screening  Completed  . PNA vac Low Risk Adult  Completed   Cancer Screenings: Lung: Low Dose CT Chest recommended if Age 63-80 years, 30 pack-year currently smoking OR have quit w/in 15years. Patient does not qualify. Colorectal: Up to date  Additional Screenings:  Hepatitis C Screening: Up to date     Plan:  I have personally reviewed and addressed the Medicare Annual Wellness questionnaire and have noted the following in the patient's chart:  A. Medical and social history B. Use of alcohol, tobacco or  illicit drugs  C. Current medications and supplements D. Functional ability and status E.  Nutritional status F.  Physical activity G. Advance directives H. List of other physicians I.  Hospitalizations, surgeries, and ER visits in previous 12 months J.  [place] such as hearing and vision if needed, cognitive and depression L. Referrals and appointments - none  In addition, I have reviewed and discussed with patient certain preventive protocols, quality metrics, and best practice recommendations. A written personalized care plan for preventive services as well as general preventive health recommendations were provided to patient.  See attached scanned questionnaire for additional information.   Signed,  Fabio Neighbors, LPN Nurse Health Advisor   Nurse Recommendations: Pt declined the tetanus vaccine today.

## 2017-09-06 NOTE — Patient Instructions (Signed)
Mark Pierce , Thank you for taking time to come for your Medicare Wellness Visit. I appreciate your ongoing commitment to your health goals. Please review the following plan we discussed and let me know if I can assist you in the future.   Screening recommendations/referrals: Colonoscopy: Up to date Recommended yearly ophthalmology/optometry visit for glaucoma screening and checkup Recommended yearly dental visit for hygiene and checkup  Vaccinations: Influenza vaccine: Up to date Pneumococcal vaccine: Up to date Tdap vaccine: Pt declines today.  Shingles vaccine: Pt declines today.     Advanced directives: Please bring a copy of your POA (Power of Attorney) and/or Living Will to your next appointment.   Conditions/risks identified: Recommend to start back going to the gym 3 days a week for at least 30 minutes.   Next appointment: Advised pt to schedule a CPE- pt declined today. Next AWV scheduled 09/24/18.  Preventive Care 70 Years and Older, Male Preventive care refers to lifestyle choices and visits with your health care provider that can promote health and wellness. What does preventive care include?  A yearly physical exam. This is also called an annual well check.  Dental exams once or twice a year.  Routine eye exams. Ask your health care provider how often you should have your eyes checked.  Personal lifestyle choices, including:  Daily care of your teeth and gums.  Regular physical activity.  Eating a healthy diet.  Avoiding tobacco and drug use.  Limiting alcohol use.  Practicing safe sex.  Taking low doses of aspirin every day.  Taking vitamin and mineral supplements as recommended by your health care provider. What happens during an annual well check? The services and screenings done by your health care provider during your annual well check will depend on your age, overall health, lifestyle risk factors, and family history of disease. Counseling  Your  health care provider may ask you questions about your:  Alcohol use.  Tobacco use.  Drug use.  Emotional well-being.  Home and relationship well-being.  Sexual activity.  Eating habits.  History of falls.  Memory and ability to understand (cognition).  Work and work Statistician. Screening  You may have the following tests or measurements:  Height, weight, and BMI.  Blood pressure.  Lipid and cholesterol levels. These may be checked every 5 years, or more frequently if you are over 38 years old.  Skin check.  Lung cancer screening. You may have this screening every year starting at age 47 if you have a 30-pack-year history of smoking and currently smoke or have quit within the past 15 years.  Fecal occult blood test (FOBT) of the stool. You may have this test every year starting at age 21.  Flexible sigmoidoscopy or colonoscopy. You may have a sigmoidoscopy every 5 years or a colonoscopy every 10 years starting at age 30.  Prostate cancer screening. Recommendations will vary depending on your family history and other risks.  Hepatitis C blood test.  Hepatitis B blood test.  Sexually transmitted disease (STD) testing.  Diabetes screening. This is done by checking your blood sugar (glucose) after you have not eaten for a while (fasting). You may have this done every 1-3 years.  Abdominal aortic aneurysm (AAA) screening. You may need this if you are a current or former smoker.  Osteoporosis. You may be screened starting at age 23 if you are at high risk. Talk with your health care provider about your test results, treatment options, and if necessary, the need for  more tests. Vaccines  Your health care provider may recommend certain vaccines, such as:  Influenza vaccine. This is recommended every year.  Tetanus, diphtheria, and acellular pertussis (Tdap, Td) vaccine. You may need a Td booster every 10 years.  Zoster vaccine. You may need this after age  77.  Pneumococcal 13-valent conjugate (PCV13) vaccine. One dose is recommended after age 60.  Pneumococcal polysaccharide (PPSV23) vaccine. One dose is recommended after age 45. Talk to your health care provider about which screenings and vaccines you need and how often you need them. This information is not intended to replace advice given to you by your health care provider. Make sure you discuss any questions you have with your health care provider. Document Released: 06/25/2015 Document Revised: 02/16/2016 Document Reviewed: 03/30/2015 Elsevier Interactive Patient Education  2017 [place] Prevention in the Home Falls can cause injuries. They can happen to people of all ages. There are many things you can do to make your home safe and to help prevent falls. What can I do on the outside of my home?  Regularly fix the edges of walkways and driveways and fix any cracks.  Remove anything that might make you trip as you walk through a door, such as a raised step or threshold.  Trim any bushes or trees on the path to your home.  Use bright outdoor lighting.  Clear any walking paths of anything that might make someone trip, such as rocks or tools.  Regularly check to see if handrails are loose or broken. Make sure that both sides of any steps have handrails.  Any raised decks and porches should have guardrails on the edges.  Have any leaves, snow, or ice cleared regularly.  Use sand or salt on walking paths during winter.  Clean up any spills in your garage right away. This includes oil or grease spills. What can I do in the bathroom?  Use night lights.  Install grab bars by the toilet and in the tub and shower. Do not use towel bars as grab bars.  Use non-skid mats or decals in the tub or shower.  If you need to sit down in the shower, use a plastic, non-slip stool.  Keep the floor dry. Clean up any water that spills on the floor as soon as it happens.  Remove  soap buildup in the tub or shower regularly.  Attach bath mats securely with double-sided non-slip rug tape.  Do not have throw rugs and other things on the floor that can make you trip. What can I do in the bedroom?  Use night lights.  Make sure that you have a light by your bed that is easy to reach.  Do not use any sheets or blankets that are too big for your bed. They should not hang down onto the floor.  Have a firm chair that has side arms. You can use this for support while you get dressed.  Do not have throw rugs and other things on the floor that can make you trip. What can I do in the kitchen?  Clean up any spills right away.  Avoid walking on wet floors.  Keep items that you use a lot in easy-to-reach places.  If you need to reach something above you, use a strong step stool that has a grab bar.  Keep electrical cords out of the way.  Do not use floor polish or wax that makes floors slippery. If you must use wax, use non-skid  floor wax.  Do not have throw rugs and other things on the floor that can make you trip. What can I do with my stairs?  Do not leave any items on the stairs.  Make sure that there are handrails on both sides of the stairs and use them. Fix handrails that are broken or loose. Make sure that handrails are as long as the stairways.  Check any carpeting to make sure that it is firmly attached to the stairs. Fix any carpet that is loose or worn.  Avoid having throw rugs at the top or bottom of the stairs. If you do have throw rugs, attach them to the floor with carpet tape.  Make sure that you have a light switch at the top of the stairs and the bottom of the stairs. If you do not have them, ask someone to add them for you. What else can I do to help prevent falls?  Wear shoes that:  Do not have high heels.  Have rubber bottoms.  Are comfortable and fit you well.  Are closed at the toe. Do not wear sandals.  If you use a  stepladder:  Make sure that it is fully opened. Do not climb a closed stepladder.  Make sure that both sides of the stepladder are locked into place.  Ask someone to hold it for you, if possible.  Clearly mark and make sure that you can see:  Any grab bars or handrails.  First and last steps.  Where the edge of each step is.  Use tools that help you move around (mobility aids) if they are needed. These include:  Canes.  Walkers.  Scooters.  Crutches.  Turn on the lights when you go into a dark area. Replace any light bulbs as soon as they burn out.  Set up your furniture so you have a clear path. Avoid moving your furniture around.  If any of your floors are uneven, fix them.  If there are any pets around you, be aware of where they are.  Review your medicines with your doctor. Some medicines can make you feel dizzy. This can increase your chance of falling. Ask your doctor what other things that you can do to help prevent falls. This information is not intended to replace advice given to you by your health care provider. Make sure you discuss any questions you have with your health care provider. Document Released: 03/25/2009 Document Revised: 11/04/2015 Document Reviewed: 07/03/2014 Elsevier Interactive Patient Education  2017 [employer].

## 2017-11-06 ENCOUNTER — Encounter: Payer: Self-pay | Admitting: Family Medicine

## 2017-11-22 ENCOUNTER — Encounter: Payer: Self-pay | Admitting: Family Medicine

## 2017-11-22 ENCOUNTER — Ambulatory Visit (INDEPENDENT_AMBULATORY_CARE_PROVIDER_SITE_OTHER): Payer: Medicare Other | Admitting: Family Medicine

## 2017-11-22 VITALS — BP 110/64 | HR 85 | Temp 97.8°F | Resp 16 | Ht 69.0 in | Wt 190.0 lb

## 2017-11-22 DIAGNOSIS — E7800 Pure hypercholesterolemia, unspecified: Secondary | ICD-10-CM | POA: Diagnosis not present

## 2017-11-22 DIAGNOSIS — I482 Chronic atrial fibrillation, unspecified: Secondary | ICD-10-CM

## 2017-11-22 DIAGNOSIS — Z125 Encounter for screening for malignant neoplasm of prostate: Secondary | ICD-10-CM

## 2017-11-22 DIAGNOSIS — I10 Essential (primary) hypertension: Secondary | ICD-10-CM | POA: Diagnosis not present

## 2017-11-22 DIAGNOSIS — Z8249 Family history of ischemic heart disease and other diseases of the circulatory system: Secondary | ICD-10-CM

## 2017-11-22 DIAGNOSIS — R7303 Prediabetes: Secondary | ICD-10-CM | POA: Diagnosis not present

## 2017-11-22 NOTE — Patient Instructions (Signed)

## 2017-11-22 NOTE — Progress Notes (Signed)
Patient: Mark Pierce Male    DOB: 1951/05/28   67 y.o.   MRN: [id number] Visit Date: 11/22/2017  Today's Provider: Lelon Huh, MD   Chief Complaint  Patient presents with  . Hypertension  . Hyperlipidemia  . Hyperglycemia   Subjective:    HPI   Hypertension, follow-up:  BP Readings from Last 3 Encounters:  11/22/17 110/64  09/06/17 134/72  08/24/16 138/68    He was last seen for hypertension 08/24/2016.  BP at that visit was 136/68. Management since that visit includes; no changes.He reports good compliance with treatment. He is not having side effects.  He is exercising. He is not adherent to low salt diet.   Outside blood pressures are not being checked. He is experiencing none.  Patient denies chest pain, chest pressure/discomfort, claudication, dyspnea, exertional chest pressure/discomfort, fatigue, irregular heart beat, lower extremity edema, near-syncope, orthopnea, palpitations, paroxysmal nocturnal dyspnea, syncope and tachypnea.   Cardiovascular risk factors include advanced age (older than 54 for men, 34 for women), dyslipidemia, hypertension and male gender.  Use of agents associated with hypertension: NSAIDS.   ------------------------------------------------------------------------    Lipid/Cholesterol, Follow-up:   Last seen for this 08/24/2016.  Management since that visit includes; labs checked, no changes.  Last Lipid Panel:    Component Value Date/Time   CHOL 199 08/24/2016 1010   TRIG 165 (H) 08/24/2016 1010   HDL 44 08/24/2016 1010   CHOLHDL 4.5 08/24/2016 1010   LDLCALC 122 (H) 08/24/2016 1010    He reports good compliance with treatment. He is not having side effects.   Wt Readings from Last 3 Encounters:  11/22/17 190 lb (86.2 kg)  09/06/17 188 lb (85.3 kg)  08/24/16 194 lb 12.8 oz (88.4 kg)    ------------------------------------------------------------------------  Prediabetes From 08/24/2016-labs checked.  Advised to avoid starchy foods and sweets. Hemoglobin A1c 6.0. Today patient comes in reporting that he has tried cutting back on sweets and starchy foods.    Allergies  Allergen Reactions  . Lisinopril     dry cough     Current Outpatient Medications:  .  amLODipine (NORVASC) 5 MG tablet, TAKE 1 TABLET BY MOUTH  DAILY, Disp: 90 tablet, Rfl: 3 .  aspirin 81 MG tablet, Take 1 tablet by mouth daily., Disp: , Rfl:  .  atorvastatin (LIPITOR) 20 MG tablet, TAKE 1 TABLET BY MOUTH  DAILY, Disp: 90 tablet, Rfl: 3 .  Chlorpheniramine Maleate (ALLERGY PO), Take by mouth daily., Disp: , Rfl:  .  losartan-hydrochlorothiazide (HYZAAR) 100-12.5 MG tablet, TAKE 1 TABLET BY MOUTH  DAILY, Disp: 90 tablet, Rfl: 3 .  Multiple Vitamins-Minerals (MULTIVITAMIN ADULT PO), Take 1 tablet by mouth daily., Disp: , Rfl:  .  tadalafil (CIALIS) 20 MG tablet, Take 0.5-1 tablets (10-20 mg total) by mouth daily as needed., Disp: 8 tablet, Rfl: 12 .  sildenafil (REVATIO) 20 MG tablet, 3-5 tablets as needed prior to intercourse, not to exceed 5 tablets in a day (Patient not taking: Reported on 11/22/2017), Disp: 30 tablet, Rfl: 6 .  sildenafil (VIAGRA) 100 MG tablet, Take 1 tablet (100 mg total) by mouth daily as needed for erectile dysfunction. (Patient not taking: Reported on 11/22/2017), Disp: 30 tablet, Rfl: 4  Review of Systems  Constitutional: Negative for appetite change, chills and fever.  Respiratory: Negative for chest tightness, shortness of breath and wheezing.   Cardiovascular: Negative for chest pain and palpitations.  Gastrointestinal: Negative for abdominal pain, nausea and vomiting.  Social History   Tobacco Use  . Smoking status: Former Smoker    Packs/day: 1.00    Types: Cigarettes    Last attempt to quit: 06/12/1998    Years since quitting: 19.4  . Smokeless tobacco: Never Used  Substance Use Topics  . Alcohol use: Yes    Alcohol/week: 12.6 oz    Types: 7 Glasses of wine, 4 Cans of beer, 10  Shots of liquor per week    Comment: moderate use   Objective:   BP 110/64 (BP Location: Right Arm, Cuff Size: Large)   Pulse 85   Temp 97.8 F (36.6 C) (Oral)   Resp 16   Ht 5\' 9"  (1.753 m)   Wt 190 lb (86.2 kg)   SpO2 97% Comment: room air  BMI 28.06 kg/m  Vitals:   11/22/17 0845 11/22/17 0848  BP: 102/60 110/64  Pulse: 85   Resp: 16   Temp: 97.8 F (36.6 C)   TempSrc: Oral   SpO2: 97%   Weight: 190 lb (86.2 kg)   Height: 5\' 9"  (1.753 m)      Physical Exam   General Appearance:    Alert, cooperative, no distress  Eyes:    PERRL, conjunctiva/corneas clear, EOM's intact       Lungs:     Clear to auscultation bilaterally, respirations unlabored  Heart:     Irregularly irregular rhythm. Normal rate   Neurologic:   Awake, alert, oriented x 3. No apparent focal neurological           defect.       EKG Atrial fibrillation    Assessment & Plan:     1. Chronic atrial fibrillation (HCC) Asymptomatic. New diagnosis. Uncertain duration of onset. Check labs and refer cardiology for evaluation. Continue ECASA for now. His CHADs-Vasc score is 2. Consider NOAC if labs normal.  - CBC - TSH - Ambulatory referral to Cardiology  2. Essential (primary) hypertension Well controlled.  Continue current medications.   - Comprehensive metabolic panel - EKG [id number]  3. Pure hypercholesterolemia He is tolerating atorvastatin well with no adverse effects.   - Lipid panel - CRP High sensitivity - Lipoprotein A (LPA) - EKG 12-Lead  4. Prediabetes  - Hemoglobin A1c  5. Family history of heart disease  - CRP High sensitivity - Lipoprotein A (LPA)  6. Prostate cancer screening  - PSA       Lelon Huh, MD  [facility]

## 2017-11-23 DIAGNOSIS — E7800 Pure hypercholesterolemia, unspecified: Secondary | ICD-10-CM | POA: Diagnosis not present

## 2017-11-23 DIAGNOSIS — I10 Essential (primary) hypertension: Secondary | ICD-10-CM | POA: Diagnosis not present

## 2017-11-23 DIAGNOSIS — Z125 Encounter for screening for malignant neoplasm of prostate: Secondary | ICD-10-CM | POA: Diagnosis not present

## 2017-11-23 DIAGNOSIS — R7303 Prediabetes: Secondary | ICD-10-CM | POA: Diagnosis not present

## 2017-11-23 DIAGNOSIS — Z8249 Family history of ischemic heart disease and other diseases of the circulatory system: Secondary | ICD-10-CM | POA: Diagnosis not present

## 2017-11-26 ENCOUNTER — Telehealth: Payer: Self-pay | Admitting: *Deleted

## 2017-11-26 NOTE — Telephone Encounter (Signed)
-----   Message from Birdie Sons, MD sent at 11/25/2017  8:17 PM EDT ----- Labs all normal. Recommend start Eliquis 5mg  twice a day #60, rf x 1 for atrial fibrillation. Stop aspirin one he start Eliquis.

## 2017-11-26 NOTE — Telephone Encounter (Signed)
LMOVM for pt to return call 

## 2017-11-27 ENCOUNTER — Telehealth: Payer: Self-pay | Admitting: *Deleted

## 2017-11-27 LAB — HEMOGLOBIN A1C
Est. average glucose Bld gHb Est-mCnc: 126 mg/dL
Hgb A1c MFr Bld: 6 % — ABNORMAL HIGH (ref 4.8–5.6)

## 2017-11-27 LAB — COMPREHENSIVE METABOLIC PANEL
ALK PHOS: 87 IU/L (ref 39–117)
ALT: 21 IU/L (ref 0–44)
AST: 18 IU/L (ref 0–40)
Albumin/Globulin Ratio: 1.7 (ref 1.2–2.2)
Albumin: 4.1 g/dL (ref 3.6–4.8)
BILIRUBIN TOTAL: 0.3 mg/dL (ref 0.0–1.2)
BUN/Creatinine Ratio: 24 (ref 10–24)
BUN: 21 mg/dL (ref 8–27)
CHLORIDE: 105 mmol/L (ref 96–106)
CO2: 22 mmol/L (ref 20–29)
Calcium: 8.9 mg/dL (ref 8.6–10.2)
Creatinine, Ser: 0.88 mg/dL (ref 0.76–1.27)
GFR calc non Af Amer: 88 mL/min/{1.73_m2} (ref >59)
GFR, EST AFRICAN AMERICAN: 101 mL/min/{1.73_m2} (ref >59)
GLUCOSE: 132 mg/dL — AB (ref 65–99)
Globulin, Total: 2.4 g/dL (ref 1.5–4.5)
Potassium: 4.2 mmol/L (ref 3.5–5.2)
Sodium: 140 mmol/L (ref 134–144)
TOTAL PROTEIN: 6.5 g/dL (ref 6.0–8.5)

## 2017-11-27 LAB — LIPID PANEL
CHOLESTEROL TOTAL: 165 mg/dL (ref 100–199)
Chol/HDL Ratio: 3.7 ratio (ref 0.0–5.0)
HDL: 45 mg/dL (ref >39)
LDL Calculated: 100 mg/dL — ABNORMAL HIGH (ref 0–99)
Triglycerides: 100 mg/dL (ref 0–149)
VLDL CHOLESTEROL CAL: 20 mg/dL (ref 5–40)

## 2017-11-27 LAB — CBC
HEMATOCRIT: 43.8 % (ref 37.5–51.0)
Hemoglobin: 15 g/dL (ref 13.0–17.7)
MCH: 30.1 pg (ref 26.6–33.0)
MCHC: 34.2 g/dL (ref 31.5–35.7)
MCV: 88 fL (ref 79–97)
PLATELETS: 273 10*3/uL (ref 150–450)
RBC: 4.99 x10E6/uL (ref 4.14–5.80)
RDW: 13.4 % (ref 12.3–15.4)
WBC: 4.7 10*3/uL (ref 3.4–10.8)

## 2017-11-27 LAB — LIPOPROTEIN A (LPA): LIPOPROTEIN (A): 9 nmol/L (ref <75)

## 2017-11-27 LAB — PSA: Prostate Specific Ag, Serum: 2.6 ng/mL (ref 0.0–4.0)

## 2017-11-27 LAB — TSH: TSH: 1.39 u[IU]/mL (ref 0.450–4.500)

## 2017-11-27 LAB — HIGH SENSITIVITY CRP: CRP, High Sensitivity: 1.05 mg/L (ref 0.00–3.00)

## 2017-11-27 NOTE — Telephone Encounter (Addendum)
Patient was inquiring about his referral to cardiology. Patient states he is going on vacation 12/02/17 through 12/29/2017. Also patient prefers to be contacted by cell phone.

## 2017-11-27 NOTE — Telephone Encounter (Signed)
Patient was advised. Patient stated that he wants to hold off on starting Eliquis until he sees the cardiologist.

## 2017-12-04 NOTE — Telephone Encounter (Signed)
Pt advised that [facility] will be calling with appointment.I also gave him their contact information

## 2017-12-11 ENCOUNTER — Telehealth: Payer: Self-pay | Admitting: Family Medicine

## 2017-12-11 NOTE — Telephone Encounter (Signed)
Appointment scheduled with Dr Ubaldo Glassing 12/31/17 at 3:00.Pt advised.

## 2017-12-25 ENCOUNTER — Other Ambulatory Visit: Payer: Self-pay | Admitting: Family Medicine

## 2017-12-31 DIAGNOSIS — I481 Persistent atrial fibrillation: Secondary | ICD-10-CM | POA: Diagnosis not present

## 2017-12-31 DIAGNOSIS — R079 Chest pain, unspecified: Secondary | ICD-10-CM | POA: Diagnosis not present

## 2018-01-01 DIAGNOSIS — I4819 Other persistent atrial fibrillation: Secondary | ICD-10-CM | POA: Insufficient documentation

## 2018-01-07 ENCOUNTER — Other Ambulatory Visit: Payer: Self-pay | Admitting: Family Medicine

## 2018-01-07 MED ORDER — LOSARTAN POTASSIUM-HCTZ 100-12.5 MG PO TABS: 1.0000 | ORAL_TABLET | Freq: Every day | ORAL | 4 refills | Status: DC

## 2018-01-07 NOTE — Telephone Encounter (Signed)
[facility] pharmacy faxed a refill request for the following medication. Thanks CC  losartan-hydrochlorothiazide (HYZAAR) 100-12.5 MG tablet

## 2018-01-21 DIAGNOSIS — R079 Chest pain, unspecified: Secondary | ICD-10-CM | POA: Diagnosis not present

## 2018-02-04 DIAGNOSIS — I10 Essential (primary) hypertension: Secondary | ICD-10-CM | POA: Diagnosis not present

## 2018-02-04 DIAGNOSIS — I481 Persistent atrial fibrillation: Secondary | ICD-10-CM | POA: Diagnosis not present

## 2018-02-04 DIAGNOSIS — E782 Mixed hyperlipidemia: Secondary | ICD-10-CM | POA: Diagnosis not present

## 2018-03-26 DIAGNOSIS — D692 Other nonthrombocytopenic purpura: Secondary | ICD-10-CM | POA: Diagnosis not present

## 2018-03-26 DIAGNOSIS — L400 Psoriasis vulgaris: Secondary | ICD-10-CM | POA: Diagnosis not present

## 2018-03-26 DIAGNOSIS — L578 Other skin changes due to chronic exposure to nonionizing radiation: Secondary | ICD-10-CM | POA: Diagnosis not present

## 2018-03-26 DIAGNOSIS — L570 Actinic keratosis: Secondary | ICD-10-CM | POA: Diagnosis not present

## 2018-04-29 ENCOUNTER — Other Ambulatory Visit: Payer: Self-pay

## 2018-04-29 NOTE — Patient Outreach (Signed)
[facility]) Care Management  04/29/2018  Mark Pierce 1950-10-02 [id number]   Medication Adherence call to Mark Pierce spoke with patient he is due on Atorvastatin and Losartan/HCTZ 100/12.5 mg, patient has medication at this time he will order it once he gets low on his medications. Mark Pierce is showing past due under [place].   [place] Management Direct Dial [phone]  Fax [phone] [email or web address]

## 2018-05-06 ENCOUNTER — Encounter: Payer: Self-pay | Admitting: Family Medicine

## 2018-05-17 DIAGNOSIS — I10 Essential (primary) hypertension: Secondary | ICD-10-CM | POA: Diagnosis not present

## 2018-05-17 DIAGNOSIS — R079 Chest pain, unspecified: Secondary | ICD-10-CM | POA: Diagnosis not present

## 2018-05-17 DIAGNOSIS — E782 Mixed hyperlipidemia: Secondary | ICD-10-CM | POA: Diagnosis not present

## 2018-05-17 DIAGNOSIS — I4819 Other persistent atrial fibrillation: Secondary | ICD-10-CM | POA: Diagnosis not present

## 2018-05-17 MED ORDER — VALSARTAN-HYDROCHLOROTHIAZIDE 160-12.5 MG PO TABS: 1.0000 | ORAL_TABLET | Freq: Every day | ORAL | 3 refills | Status: DC

## 2018-05-23 DIAGNOSIS — D1800 Hemangioma unspecified site: Secondary | ICD-10-CM | POA: Diagnosis not present

## 2018-05-23 DIAGNOSIS — D229 Melanocytic nevi, unspecified: Secondary | ICD-10-CM | POA: Diagnosis not present

## 2018-05-23 DIAGNOSIS — L821 Other seborrheic keratosis: Secondary | ICD-10-CM | POA: Diagnosis not present

## 2018-05-23 DIAGNOSIS — L578 Other skin changes due to chronic exposure to nonionizing radiation: Secondary | ICD-10-CM | POA: Diagnosis not present

## 2018-05-23 DIAGNOSIS — L400 Psoriasis vulgaris: Secondary | ICD-10-CM | POA: Diagnosis not present

## 2018-09-24 ENCOUNTER — Ambulatory Visit: Payer: Self-pay

## 2018-10-28 DIAGNOSIS — H905 Unspecified sensorineural hearing loss: Secondary | ICD-10-CM | POA: Diagnosis not present

## 2018-11-21 DIAGNOSIS — I10 Essential (primary) hypertension: Secondary | ICD-10-CM | POA: Diagnosis not present

## 2018-11-21 DIAGNOSIS — I4819 Other persistent atrial fibrillation: Secondary | ICD-10-CM | POA: Diagnosis not present

## 2018-11-21 DIAGNOSIS — E782 Mixed hyperlipidemia: Secondary | ICD-10-CM | POA: Diagnosis not present

## 2018-11-28 ENCOUNTER — Ambulatory Visit: Payer: Medicare Other

## 2018-12-19 ENCOUNTER — Other Ambulatory Visit: Payer: Self-pay | Admitting: Family Medicine

## 2018-12-20 DIAGNOSIS — I4819 Other persistent atrial fibrillation: Secondary | ICD-10-CM | POA: Diagnosis not present

## 2018-12-20 DIAGNOSIS — E782 Mixed hyperlipidemia: Secondary | ICD-10-CM | POA: Diagnosis not present

## 2018-12-20 DIAGNOSIS — I10 Essential (primary) hypertension: Secondary | ICD-10-CM | POA: Diagnosis not present

## 2018-12-31 ENCOUNTER — Ambulatory Visit: Payer: Medicare Other

## 2019-01-07 ENCOUNTER — Ambulatory Visit (INDEPENDENT_AMBULATORY_CARE_PROVIDER_SITE_OTHER): Payer: Medicare Other

## 2019-01-07 ENCOUNTER — Other Ambulatory Visit: Payer: Self-pay

## 2019-01-07 DIAGNOSIS — Z1211 Encounter for screening for malignant neoplasm of colon: Secondary | ICD-10-CM | POA: Diagnosis not present

## 2019-01-07 DIAGNOSIS — Z0000 Encounter for general adult medical examination without abnormal findings: Secondary | ICD-10-CM

## 2019-01-07 NOTE — Patient Instructions (Signed)
Mark Pierce , Thank you for taking time to come for your Medicare Wellness Visit. I appreciate your ongoing commitment to your health goals. Please review the following plan we discussed and let me know if I can assist you in the future.   Screening recommendations/referrals: Colonoscopy: Cologuard ordered today.  Recommended yearly ophthalmology/optometry visit for glaucoma screening and checkup Recommended yearly dental visit for hygiene and checkup  Vaccinations: Influenza vaccine: Up to date Pneumococcal vaccine: Completed series Tdap vaccine: Pt declines today.  Shingles vaccine: Pt declines today.     Advanced directives: Please bring a copy of your POA (Power of Attorney) and/or Living Will to your next appointment.   Conditions/risks identified: Recommend to start back at gym and exercise 3 days a week for 30 minutes at a time.    Next appointment: 04/29/19 @ 9:00 AM with Dr Caryn Section.   Preventive Care 66 Years and Older, Male Preventive care refers to lifestyle choices and visits with your health care provider that can promote health and wellness. What does preventive care include?  A yearly physical exam. This is also called an annual well check.  Dental exams once or twice a year.  Routine eye exams. Ask your health care provider how often you should have your eyes checked.  Personal lifestyle choices, including:  Daily care of your teeth and gums.  Regular physical activity.  Eating a healthy diet.  Avoiding tobacco and drug use.  Limiting alcohol use.  Practicing safe sex.  Taking low doses of aspirin every day.  Taking vitamin and mineral supplements as recommended by your health care provider. What happens during an annual well check? The services and screenings done by your health care provider during your annual well check will depend on your age, overall health, lifestyle risk factors, and family history of disease. Counseling  Your health care  provider may ask you questions about your:  Alcohol use.  Tobacco use.  Drug use.  Emotional well-being.  Home and relationship well-being.  Sexual activity.  Eating habits.  History of falls.  Memory and ability to understand (cognition).  Work and work Statistician. Screening  You may have the following tests or measurements:  Height, weight, and BMI.  Blood pressure.  Lipid and cholesterol levels. These may be checked every 5 years, or more frequently if you are over 45 years old.  Skin check.  Lung cancer screening. You may have this screening every year starting at age 65 if you have a 30-pack-year history of smoking and currently smoke or have quit within the past 15 years.  Fecal occult blood test (FOBT) of the stool. You may have this test every year starting at age 65.  Flexible sigmoidoscopy or colonoscopy. You may have a sigmoidoscopy every 5 years or a colonoscopy every 10 years starting at age 68.  Prostate cancer screening. Recommendations will vary depending on your family history and other risks.  Hepatitis C blood test.  Hepatitis B blood test.  Sexually transmitted disease (STD) testing.  Diabetes screening. This is done by checking your blood sugar (glucose) after you have not eaten for a while (fasting). You may have this done every 1-3 years.  Abdominal aortic aneurysm (AAA) screening. You may need this if you are a current or former smoker.  Osteoporosis. You may be screened starting at age 24 if you are at high risk. Talk with your health care provider about your test results, treatment options, and if necessary, the need for more tests. Vaccines  Your health care provider may recommend certain vaccines, such as:  Influenza vaccine. This is recommended every year.  Tetanus, diphtheria, and acellular pertussis (Tdap, Td) vaccine. You may need a Td booster every 10 years.  Zoster vaccine. You may need this after age 28.  Pneumococcal  13-valent conjugate (PCV13) vaccine. One dose is recommended after age 89.  Pneumococcal polysaccharide (PPSV23) vaccine. One dose is recommended after age 41. Talk to your health care provider about which screenings and vaccines you need and how often you need them. This information is not intended to replace advice given to you by your health care provider. Make sure you discuss any questions you have with your health care provider. Document Released: 06/25/2015 Document Revised: 02/16/2016 Document Reviewed: 03/30/2015 Elsevier Interactive Patient Education  2017 [employer] Prevention in the Home Falls can cause injuries. They can happen to people of all ages. There are many things you can do to make your home safe and to help prevent falls. What can I do on the outside of my home?  Regularly fix the edges of walkways and driveways and fix any cracks.  Remove anything that might make you trip as you walk through a door, such as a raised step or threshold.  Trim any bushes or trees on the path to your home.  Use bright outdoor lighting.  Clear any walking paths of anything that might make someone trip, such as rocks or tools.  Regularly check to see if handrails are loose or broken. Make sure that both sides of any steps have handrails.  Any raised decks and porches should have guardrails on the edges.  Have any leaves, snow, or ice cleared regularly.  Use sand or salt on walking paths during winter.  Clean up any spills in your garage right away. This includes oil or grease spills. What can I do in the bathroom?  Use night lights.  Install grab bars by the toilet and in the tub and shower. Do not use towel bars as grab bars.  Use non-skid mats or decals in the tub or shower.  If you need to sit down in the shower, use a plastic, non-slip stool.  Keep the floor dry. Clean up any water that spills on the floor as soon as it happens.  Remove soap buildup in the  tub or shower regularly.  Attach bath mats securely with double-sided non-slip rug tape.  Do not have throw rugs and other things on the floor that can make you trip. What can I do in the bedroom?  Use night lights.  Make sure that you have a light by your bed that is easy to reach.  Do not use any sheets or blankets that are too big for your bed. They should not hang down onto the floor.  Have a firm chair that has side arms. You can use this for support while you get dressed.  Do not have throw rugs and other things on the floor that can make you trip. What can I do in the kitchen?  Clean up any spills right away.  Avoid walking on wet floors.  Keep items that you use a lot in easy-to-reach places.  If you need to reach something above you, use a strong step stool that has a grab bar.  Keep electrical cords out of the way.  Do not use floor polish or wax that makes floors slippery. If you must use wax, use non-skid floor wax.  Do  not have throw rugs and other things on the floor that can make you trip. What can I do with my stairs?  Do not leave any items on the stairs.  Make sure that there are handrails on both sides of the stairs and use them. Fix handrails that are broken or loose. Make sure that handrails are as long as the stairways.  Check any carpeting to make sure that it is firmly attached to the stairs. Fix any carpet that is loose or worn.  Avoid having throw rugs at the top or bottom of the stairs. If you do have throw rugs, attach them to the floor with carpet tape.  Make sure that you have a light switch at the top of the stairs and the bottom of the stairs. If you do not have them, ask someone to add them for you. What else can I do to help prevent falls?  Wear shoes that:  Do not have high heels.  Have rubber bottoms.  Are comfortable and fit you well.  Are closed at the toe. Do not wear sandals.  If you use a stepladder:  Make sure that it  is fully opened. Do not climb a closed stepladder.  Make sure that both sides of the stepladder are locked into place.  Ask someone to hold it for you, if possible.  Clearly mark and make sure that you can see:  Any grab bars or handrails.  First and last steps.  Where the edge of each step is.  Use tools that help you move around (mobility aids) if they are needed. These include:  Canes.  Walkers.  Scooters.  Crutches.  Turn on the lights when you go into a dark area. Replace any light bulbs as soon as they burn out.  Set up your furniture so you have a clear path. Avoid moving your furniture around.  If any of your floors are uneven, fix them.  If there are any pets around you, be aware of where they are.  Review your medicines with your doctor. Some medicines can make you feel dizzy. This can increase your chance of falling. Ask your doctor what other things that you can do to help prevent falls. This information is not intended to replace advice given to you by your health care provider. Make sure you discuss any questions you have with your health care provider. Document Released: 03/25/2009 Document Revised: 11/04/2015 Document Reviewed: 07/03/2014 Elsevier Interactive Patient Education  2017 [employer].

## 2019-01-07 NOTE — Progress Notes (Signed)
Subjective:   Mark Pierce is a 68 y.o. male who presents for Medicare Annual/Subsequent preventive examination.    This visit is being conducted through telemedicine due to the COVID-19 pandemic. This patient has given me verbal consent via doximity to conduct this visit, patient states they are participating from their home address. Some vital signs may be absent or patient reported.    Patient identification: identified by name, DOB, and current address  Review of Systems:  N/A  Cardiac Risk Factors include: advanced age (>67men, >61 women);dyslipidemia;male gender;hypertension     Objective:    Vitals: There were no vitals taken for this visit.  There is no height or weight on file to calculate BMI. Unable to obtain vitals due to visit being conducted via telephonically.   Advanced Directives 01/07/2019 09/06/2017 08/24/2016  Does Patient Have a Medical Advance Directive? Yes Yes Yes  Type of Paramedic of [place];Living will [place];Living will [place];Living will  Copy of [place] in Chart? No - copy requested No - copy requested No - copy requested    Tobacco Social History   Tobacco Use  Smoking Status Former Smoker  . Packs/day: 1.00  . Types: Cigarettes  . Quit date: 06/12/1998  . Years since quitting: 20.5  Smokeless Tobacco Never Used     Counseling given: Not Answered   Clinical Intake:  Pre-visit preparation completed: Yes  Pain : No/denies pain Pain Score: 0-No pain     Nutritional Risks: None Diabetes: No  How often do you need to have someone help you when you read instructions, pamphlets, or other written materials from your doctor or pharmacy?: 1 - Never  Interpreter Needed?: No  Information entered by :: [facility], LPN  Past Medical History:  Diagnosis Date  . A-fib ([place])   . History of chicken pox   . Hyperlipidemia   . Hypertension    Past  Surgical History:  Procedure Laterality Date  . APPENDECTOMY  1981  . SPINAL FUSION  2008   patient stated that he had a cervical spine infusion    Family History  Problem Relation Age of Onset  . CAD Father   . Colitis Father   . Heart attack Father   . Hyperlipidemia Brother   . Mitral valve prolapse Brother   . Heart attack Paternal Grandmother    Social History   Socioeconomic History  . Marital status: Married    Spouse name: Not on file  . Number of children: 2  . Years of education: Not on file  . Highest education level: Bachelor's degree (e.g., BA, AB, BS)  Occupational History  . 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 Financial resource strain: Not hard at all  . Food insecurity    Worry: Never true    Inability: Never true  . Transportation needs    Medical: No    Non-medical: No  Tobacco Use  . Smoking status: Former Smoker    Packs/day: 1.00    Types: Cigarettes    Quit date: 06/12/1998    Years since quitting: 20.5  . Smokeless tobacco: Never Used  Substance and Sexual Activity  . Alcohol use: Yes    Alcohol/week: 21.0 standard drinks    Types: 7 Glasses of wine, 4 Cans of beer, 10 Shots of liquor per week    Comment: moderate use  . Drug  use: No  . Sexual activity: Not on file  Lifestyle  . Physical activity    Days per week: 0 days    Minutes per session: 0 min  . Stress: Not at all  Relationships  . Social Herbalist on phone: Patient refused    Gets together: Patient refused    Attends religious service: Patient refused    Active member of club or organization: Patient refused    Attends meetings of clubs or organizations: Patient refused    Relationship status: Patient refused  Other Topics Concern  . Not on file  Social History Narrative  . Not on file    Outpatient Encounter Medications as of 01/07/2019  Medication Sig  . amLODipine (NORVASC) 5 MG tablet TAKE 1  TABLET BY MOUTH  DAILY  . aspirin 81 MG tablet Take 1 tablet by mouth daily.  Marland Kitchen atorvastatin (LIPITOR) 20 MG tablet TAKE 1 TABLET BY MOUTH  DAILY  . loratadine (CLARITIN) 10 MG tablet Take 10 mg by mouth daily.  . Multiple Vitamins-Minerals (MULTIVITAMIN ADULT PO) Take 1 tablet by mouth daily.  . tadalafil (CIALIS) 20 MG tablet Take 0.5-1 tablets (10-20 mg total) by mouth daily as needed.  . valsartan-hydrochlorothiazide (DIOVAN HCT) 160-12.5 MG tablet Take 1 tablet by mouth daily.  . Chlorpheniramine Maleate (ALLERGY PO) Take by mouth daily.  Marland Kitchen ELIQUIS 5 MG TABS tablet Take 5 mg by mouth 2 (two) times daily.  . sildenafil (REVATIO) 20 MG tablet 3-5 tablets as needed prior to intercourse, not to exceed 5 tablets in a day (Patient not taking: Reported on 11/22/2017)  . sildenafil (VIAGRA) 100 MG tablet Take 1 tablet (100 mg total) by mouth daily as needed for erectile dysfunction. (Patient not taking: Reported on 11/22/2017)   No facility-administered encounter medications on file as of 01/07/2019.     Activities of Daily Living In your present state of health, do you have any difficulty performing the following activities: 01/07/2019  Hearing? N  Vision? N  Comment Wears eye glasses daily.  Difficulty concentrating or making decisions? N  Walking or climbing stairs? N  Dressing or bathing? N  Doing errands, shopping? N  Preparing Food and eating ? N  Using the Toilet? N  In the past six months, have you accidently leaked urine? N  Do you have problems with loss of bowel control? N  Managing your Medications? N  Managing your Finances? N  Housekeeping or managing your Housekeeping? N  Some recent data might be hidden    Patient Care Team: Birdie Sons, MD as PCP - General (Family Medicine) Ubaldo Glassing Javier Docker, MD as Consulting Physician (Cardiology) Jannet Mantis, MD (Dermatology)   Assessment:   This is a routine wellness examination for Vladimir.  Exercise Activities and  Dietary recommendations Current Exercise Habits: Home exercise routine, Type of exercise: walking, Frequency (Times/Week): 2, Intensity: Mild, Exercise limited by: None identified  Goals    . Exercise 3x per week (30 min per time)     Recommend to start back going to the gym 3 days a week for at least 30 minutes.     . Increase water intake     Recommend increasing water intake to 3-4 glasses a day.       Fall Risk: Fall Risk  01/07/2019 09/06/2017 08/24/2016  Falls in the past year? 0 No No    FALL RISK PREVENTION PERTAINING TO THE HOME:  Any stairs in or around the home?  Yes  If so, are there any without handrails? No   Home free of loose throw rugs in walkways, pet beds, electrical cords, etc? Yes  Adequate lighting in your home to reduce risk of falls? Yes   ASSISTIVE DEVICES UTILIZED TO PREVENT FALLS:  Life alert? No  Use of a cane, walker or w/c? No  Grab bars in the bathroom? Yes  Shower chair or bench in shower? No  Elevated toilet seat or a handicapped toilet? No   TIMED UP AND GO:  Was the test performed? No .    Depression Screen PHQ 2/9 Scores 01/07/2019 09/06/2017 09/06/2017 08/24/2016  PHQ - 2 Score 0 0 0 0  PHQ- 9 Score - 0 - 0    Cognitive Function     6CIT Screen 01/07/2019 08/24/2016  What Year? 0 points 0 points  What month? 0 points 0 points  What time? 0 points 0 points  Count back from 20 0 points 0 points  Months in reverse 0 points 0 points  Repeat phrase 0 points 0 points  Total Score 0 0    Immunization History  Administered Date(s) Administered  . H1N1 04/27/2008  . Influenza, High Dose Seasonal PF 03/11/2017  . Influenza-Unspecified 04/13/2015, 03/26/2018  . Pneumococcal Conjugate-13 10/06/2013  . Pneumococcal Polysaccharide-23 04/28/2015  . Td 06/02/2003  . Zoster 03/08/2013    Qualifies for Shingles Vaccine? Yes  Zostavax completed 03/08/13. Due for Shingrix. Education has been provided regarding the importance of this vaccine.  Pt has been advised to call insurance company to determine out of pocket expense. Advised may also receive vaccine at local pharmacy or Health Dept. Verbalized acceptance and understanding.  Tdap: Although this vaccine is not a covered service during a Wellness Exam, does the patient still wish to receive this vaccine today?  No .   Flu Vaccine: Up to date  Pneumococcal Vaccine: Completed series  Screening Tests Health Maintenance  Topic Date Due  . COLONOSCOPY  07/22/2018  . TETANUS/TDAP  06/12/2026 (Originally 06/01/2013)  . INFLUENZA VACCINE  01/11/2019  . Hepatitis C Screening  Completed  . PNA vac Low Risk Adult  Completed   Cancer Screenings:  Colorectal Screening: Completed 07/22/08. Repeat every 10 years. Cologuard order placed today.  Lung Cancer Screening: (Low Dose CT Chest recommended if Age 4-80 years, 30 pack-year currently smoking OR have quit w/in 15years.) does not qualify.   Additional Screening:  Hepatitis C Screening: Up to date  Vision Screening: Recommended annual ophthalmology exams for early detection of glaucoma and other disorders of the eye.  Dental Screening: Recommended annual dental exams for proper oral hygiene  Community Resource Referral:  CRR required this visit?  No        Plan:  I have personally reviewed and addressed the Medicare Annual Wellness questionnaire and have noted the following in the patient's chart:  A. Medical and social history B. Use of alcohol, tobacco or illicit drugs  C. Current medications and supplements D. Functional ability and status E.  Nutritional status F.  Physical activity G. Advance directives H. List of other physicians I.  Hospitalizations, surgeries, and ER visits in previous 12 months J.  [place] such as hearing and vision if needed, cognitive and depression L. Referrals and appointments   In addition, I have reviewed and discussed with patient certain preventive protocols, quality  metrics, and best practice recommendations. A written personalized care plan for preventive services as well as general preventive health recommendations were provided  to patient.   Glendora Score, [place]  6/81/5947 Nurse Health Advisor   Nurse Notes: None.

## 2019-01-10 ENCOUNTER — Encounter: Payer: Self-pay | Admitting: Family Medicine

## 2019-01-13 ENCOUNTER — Telehealth: Payer: Self-pay | Admitting: Family Medicine

## 2019-01-13 NOTE — Telephone Encounter (Signed)
Please call patient regarding mychart message below.   ----- Message -----  From: [name]  Sent: 01/10/2019  4:28 PM EDT  To: [facility]  Subject: Non-Urgent Medical Question             November 17th appt no good.  Will be away. Please push out one week and set for first appointment for the day.    Also, have a digital pdf file of my [place] and Living Will attached per McKenzie's request in my wellness meeting call.

## 2019-01-14 NOTE — Telephone Encounter (Signed)
Apt rescheduled to 05/07/2019 at 9am.  Pt advised.    Thanks,   -Mickel Baas

## 2019-03-14 ENCOUNTER — Other Ambulatory Visit: Payer: Self-pay | Admitting: Family Medicine

## 2019-03-26 DIAGNOSIS — L570 Actinic keratosis: Secondary | ICD-10-CM | POA: Diagnosis not present

## 2019-03-26 DIAGNOSIS — L578 Other skin changes due to chronic exposure to nonionizing radiation: Secondary | ICD-10-CM | POA: Diagnosis not present

## 2019-03-26 DIAGNOSIS — L2089 Other atopic dermatitis: Secondary | ICD-10-CM | POA: Diagnosis not present

## 2019-03-27 DIAGNOSIS — H2513 Age-related nuclear cataract, bilateral: Secondary | ICD-10-CM | POA: Diagnosis not present

## 2019-04-11 DIAGNOSIS — I4819 Other persistent atrial fibrillation: Secondary | ICD-10-CM | POA: Diagnosis not present

## 2019-04-11 DIAGNOSIS — E782 Mixed hyperlipidemia: Secondary | ICD-10-CM | POA: Diagnosis not present

## 2019-04-11 DIAGNOSIS — I10 Essential (primary) hypertension: Secondary | ICD-10-CM | POA: Diagnosis not present

## 2019-04-29 ENCOUNTER — Encounter: Payer: Medicare Other | Admitting: Family Medicine

## 2019-05-06 NOTE — Progress Notes (Signed)
Patient: Mark Pierce, Male    DOB: Mar 17, 1951, 68 y.o.   MRN: [id number] Visit Date: 05/07/2019  Today's Provider: Lelon Huh, MD   Chief Complaint  Patient presents with  . Annual Exam  . Hypertension  . Hyperlipidemia  . Hyperglycemia   Subjective:     Patient had a AWE with McKenzie on 01/07/2019.    Annual physical exam Mark Pierce is a 68 y.o. male who presents today for health maintenance and complete physical. He feels fairly well. He reports exercising 3-4 times weekly. He reports he is sleeping fairly well.  -----------------------------------------------------------------  Hypertension, follow-up:     BP Readings from Last 3 Encounters:  11/22/17 110/64  09/06/17 134/72  08/24/16 138/68    He was last seen for hypertension 11/22/2017 BP at that visit was 110/64. Management since that visit includes no changes. He reports good compliance with treatment. He is not having side effects.  He is exercising. He is not adherent to low salt diet.   Outside blood pressures are not being checked. He is experiencing none.  Patient denies chest pain, chest pressure/discomfort, claudication, dyspnea, exertional chest pressure/discomfort, fatigue, irregular heart beat, lower extremity edema, near-syncope, orthopnea, palpitations, paroxysmal nocturnal dyspnea, syncope and tachypnea.   Cardiovascular risk factors include advanced age (older than 7 for men, 63 for women), dyslipidemia, hypertension and male gender.  Use of agents associated with hypertension: NSAIDS.   ------------------------------------------------------------------------   Lipid/Cholesterol, Follow-up:   Last seen for this 11/22/2017  Management since that visit includes no changes  Last Lipid Panel: Lab Results  Component Value Date   CHOL 165 11/23/2017   HDL 45 11/23/2017   LDLCALC 100 (H) 11/23/2017   TRIG 100 11/23/2017   CHOLHDL 3.7 11/23/2017   He reports good  compliance with treatment. He is not having side effects.      Wt Readings from Last 3 Encounters:  11/22/17 190 lb (86.2 kg)  09/06/17 188 lb (85.3 kg)  08/24/16 194 lb 12.8 oz (88.4 kg)    ------------------------------------------------------------------------  Prediabetes, Follow-up:   Lab Results  Component Value Date   HGBA1C 6.0 (H) 11/23/2017   HGBA1C 6.0 (H) 08/24/2016   HGBA1C 5.9 04/28/2015   GLUCOSE 132 (H) 11/23/2017   GLUCOSE 139 (H) 08/24/2016   GLUCOSE 127 (H) 04/28/2015    Last seen for for this 11/23/2017 Management since that visit includes no changes. Current symptoms include none   Weight trend: stable Prior visit with dietician: no Current diet: in general, a "healthy" diet   Current exercise: walking and yard work  Pertinent Labs:    Component Value Date/Time   CHOL 165 11/23/2017 0808   TRIG 100 11/23/2017 0808   CHOLHDL 3.7 11/23/2017 0808   CREATININE 0.88 11/23/2017 0808    Wt Readings from Last 3 Encounters:  05/07/19 195 lb (88.5 kg)  11/22/17 190 lb (86.2 kg)  09/06/17 188 lb (85.3 kg)    Is now on eliquis for atrial fibrillation followed by Dr. Ubaldo Glassing. He reports he is tolerating well with no adverse effects. Denies dyspnea and chest pains.   Review of Systems  Constitutional: Negative.   Respiratory: Negative.   Cardiovascular: Negative.   Endocrine: Negative.   Musculoskeletal: Negative.     Social History      He  reports that he quit smoking about 20 years ago. His smoking use included cigarettes. He smoked 1.00 pack per day. He has never used smokeless tobacco. He  reports current alcohol use of about 21.0 standard drinks of alcohol per week. He reports that he does not use drugs.       Social History   Socioeconomic History  . Marital status: Married    Spouse name: Not on file  . Number of children: 2  . Years of education: Not on file  . Highest education level: Bachelor's degree (e.g., BA, AB, BS)   Occupational History  . 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 Financial resource strain: Not hard at all  . Food insecurity    Worry: Never true    Inability: Never true  . Transportation needs    Medical: No    Non-medical: No  Tobacco Use  . Smoking status: Former Smoker    Packs/day: 1.00    Types: Cigarettes    Quit date: 06/12/1998    Years since quitting: 20.9  . Smokeless tobacco: Never Used  Substance and Sexual Activity  . Alcohol use: Yes    Alcohol/week: 21.0 standard drinks    Types: 7 Glasses of wine, 4 Cans of beer, 10 Shots of liquor per week    Comment: moderate use  . Drug use: No  . Sexual activity: Not on file  Lifestyle  . Physical activity    Days per week: 0 days    Minutes per session: 0 min  . Stress: Not at all  Relationships  . Social Herbalist on phone: Patient refused    Gets together: Patient refused    Attends religious service: Patient refused    Active member of club or organization: Patient refused    Attends meetings of clubs or organizations: Patient refused    Relationship status: Patient refused  Other Topics Concern  . Not on file  Social History Narrative  . Not on file    Past Medical History:  Diagnosis Date  . A-fib ([place])   . History of chicken pox   . Hyperlipidemia   . Hypertension      Patient Active Problem List   Diagnosis Date Noted  . Persistent atrial fibrillation ([place]) 01/01/2018  . Hearing loss 04/27/2015  . 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 Prediabetes 10/07/2009  . Essential (primary) hypertension 04/27/2008  . HLD (hyperlipidemia) 06/13/2003    Past Surgical History:  Procedure Laterality Date  . APPENDECTOMY  1981  . SPINAL FUSION  2008   patient stated that he had a cervical spine infusion     Family History        Family Status  Relation Name Status  . Mother  Deceased       has colorectal  adenoma, natural causes for death  . Father  Deceased       also had Colorectal Adenoma and had Bypass  . Brother  Alive  . Son  Alive  . Son  Alive  . PGM  (Not Specified)        His family history includes CAD in his father; Colitis in his father; Heart attack in his father and paternal grandmother; Hyperlipidemia in his brother; Mitral valve prolapse in his brother.      Allergies  Allergen Reactions  . Lisinopril Cough    dry cough dry cough     Current Outpatient Medications:  .  atorvastatin (LIPITOR) 20 MG tablet, TAKE 1 TABLET BY MOUTH  DAILY, Disp: 90 tablet, Rfl: 0 .  diltiazem (CARDIZEM CD) 120 MG 24 hr capsule, Take 120 mg by mouth daily., Disp: , Rfl:  .  ELIQUIS 5 MG TABS tablet, Take 5 mg by mouth 2 (two) times daily., Disp: , Rfl:  .  loratadine (CLARITIN) 10 MG tablet, Take 10 mg by mouth daily., Disp: , Rfl:  .  Multiple Vitamins-Minerals (MULTIVITAMIN ADULT PO), Take 1 tablet by mouth daily., Disp: , Rfl:  .  valsartan-hydrochlorothiazide (DIOVAN-HCT) 160-12.5 MG tablet, TAKE 1 TABLET BY MOUTH  DAILY, Disp: 90 tablet, Rfl: 0 .  sildenafil (REVATIO) 20 MG tablet, 3-5 tablets as needed prior to intercourse, not to exceed 5 tablets in a day (Patient not taking: Reported on 11/22/2017), Disp: 30 tablet, Rfl: 6 .  sildenafil (VIAGRA) 100 MG tablet, Take 1 tablet (100 mg total) by mouth daily as needed for erectile dysfunction. (Patient not taking: Reported on 11/22/2017), Disp: 30 tablet, Rfl: 4 .  tadalafil (CIALIS) 20 MG tablet, Take 0.5-1 tablets (10-20 mg total) by mouth daily as needed. (Patient not taking: Reported on 05/07/2019), Disp: 8 tablet, Rfl: 12   Patient Care Team: Birdie Sons, MD as PCP - General (Family Medicine) Ubaldo Glassing, Javier Docker, MD as Consulting Physician (Cardiology) Jannet Mantis, MD (Dermatology)    Objective:    Vitals: BP 122/70 (BP Location: Left Arm, Patient Position: Sitting, Cuff Size: Large)   Pulse 80   Temp (!) 96.8 F  (36 C) (Temporal)   Resp 16   Wt 195 lb (88.5 kg)   SpO2 96% Comment: room air  BMI 28.80 kg/m    Vitals:   05/07/19 0910  BP: 122/70  Pulse: 80  Resp: 16  Temp: (!) 96.8 F (36 C)  TempSrc: Temporal  SpO2: 96%  Weight: 195 lb (88.5 kg)     Physical Exam   General Appearance:    Well developed, well nourished male. Alert, cooperative, in no acute distress, appears stated age  Head:    Normocephalic, without obvious abnormality, atraumatic  Eyes:    PERRL, conjunctiva/corneas clear, EOM's intact, fundi    benign, both eyes       Ears:    Normal TM's and external ear canals, both ears. HOH bilaterally  Nose:   Nares normal, septum midline, mucosa normal, no drainage   or sinus tenderness  Throat:   Lips, mucosa, and tongue normal; teeth and gums normal  Neck:   Supple, symmetrical, trachea midline, no adenopathy;       thyroid:  No enlargement/tenderness/nodules; no carotid   bruit or JVD  Back:     Symmetric, no curvature, ROM normal, no CVA tenderness  Lungs:     Clear to auscultation bilaterally, respirations unlabored  Chest wall:    No tenderness or deformity  Heart:    Normal heart rate. Irregularly irregular rhythm. No murmurs, rubs, or gallops.  S1 and S2 normal  Abdomen:     Soft, non-tender, bowel sounds active all four quadrants,    no masses, no organomegaly  Genitalia:    deferred  Rectal:    deferred  Extremities:   All extremities are intact. No cyanosis or edema  Pulses:   2+ and symmetric all extremities  Skin:   Skin color, texture, turgor normal, no rashes or lesions  Lymph nodes:   Cervical, supraclavicular, and axillary nodes normal  Neurologic:   CNII-XII intact. Normal strength, sensation and reflexes      throughout    Depression Screen [facility] 2/9 Scores 01/07/2019 09/06/2017 09/06/2017 08/24/2016  PHQ - 2 Score 0 0 0 0  PHQ- 9 Score - 0 - 0       Assessment & Plan:     Routine Health Maintenance and Physical Exam  Exercise Activities and  Dietary recommendations Goals    . Exercise 3x per week (30 min per time)     Recommend to start back going to the gym 3 days a week for at least 30 minutes.     . Increase water intake     Recommend increasing water intake to 3-4 glasses a day.       Immunization History  Administered Date(s) Administered  . H1N1 04/27/2008  . Influenza, High Dose Seasonal PF 03/11/2017, 03/21/2019  . Influenza-Unspecified 04/13/2015, 03/26/2018  . Pneumococcal Conjugate-13 10/06/2013  . Pneumococcal Polysaccharide-23 04/28/2015  . Td 06/02/2003  . Zoster 03/08/2013    Health Maintenance  Topic Date Due  . COLONOSCOPY  07/22/2018  . INFLUENZA VACCINE  01/11/2019  . TETANUS/TDAP  06/12/2026 (Originally 06/01/2013)  . Hepatitis C Screening  Completed  . PNA vac Low Risk Adult  Completed     Discussed health benefits of physical activity, and encouraged him to engage in regular exercise appropriate for his age and condition.    --------------------------------------------------------------------  1. Annual physical exam Doing well with generally normal exam.   2. Prostate cancer screening  - PSA  3. Colon cancer screening He has cologuard kit at home and plans to complete it soon.   4. Prediabetes  - HgB A1c  5. Essential (primary) hypertension Well controlled.  Continue current medications.    6. Pure hypercholesterolemia He is tolerating atorvastatin well with no adverse effects.   - Comprehensive metabolic panel - Lipid panel (fasting)  7. Persistent atrial fibrillation (HCC) Rate well controlled. Doing well with NOAC for CVA prophylaxis.  - CBC  The entirety of the information documented in the History of Present Illness, Review of Systems and Physical Exam were personally obtained by me. Portions of this information were initially documented by Idelle Jo, CMA and reviewed by me for thoroughness and accuracy.    Lelon Huh, MD  [facility]

## 2019-05-07 ENCOUNTER — Ambulatory Visit (INDEPENDENT_AMBULATORY_CARE_PROVIDER_SITE_OTHER): Payer: Medicare Other | Admitting: Family Medicine

## 2019-05-07 ENCOUNTER — Other Ambulatory Visit: Payer: Self-pay

## 2019-05-07 VITALS — BP 122/70 | HR 80 | Temp 96.8°F | Resp 16 | Wt 195.0 lb

## 2019-05-07 DIAGNOSIS — Z0000 Encounter for general adult medical examination without abnormal findings: Secondary | ICD-10-CM

## 2019-05-07 DIAGNOSIS — Z125 Encounter for screening for malignant neoplasm of prostate: Secondary | ICD-10-CM | POA: Diagnosis not present

## 2019-05-07 DIAGNOSIS — I4819 Other persistent atrial fibrillation: Secondary | ICD-10-CM

## 2019-05-07 DIAGNOSIS — Z1211 Encounter for screening for malignant neoplasm of colon: Secondary | ICD-10-CM

## 2019-05-07 DIAGNOSIS — I10 Essential (primary) hypertension: Secondary | ICD-10-CM

## 2019-05-07 DIAGNOSIS — R7303 Prediabetes: Secondary | ICD-10-CM | POA: Diagnosis not present

## 2019-05-07 DIAGNOSIS — E7800 Pure hypercholesterolemia, unspecified: Secondary | ICD-10-CM

## 2019-05-07 NOTE — Patient Instructions (Addendum)
.   Please review the attached list of medications and notify my office if there are any errors.   . Please bring all of your medications to every appointment so we can make sure that our medication list is the same as yours.   The CDC recommends two doses of Shingrix (the shingles vaccine) separated by 2 to 6 months for adults age 70 years and older. I recommend checking with your insurance plan regarding coverage for this vaccine.    Don't forgot to collect stool sample for the Cologuard test

## 2019-05-08 LAB — PSA: Prostate Specific Ag, Serum: 3.4 ng/mL (ref 0.0–4.0)

## 2019-05-08 LAB — COMPREHENSIVE METABOLIC PANEL
ALT: 34 IU/L (ref 0–44)
AST: 25 IU/L (ref 0–40)
Albumin/Globulin Ratio: 1.9 (ref 1.2–2.2)
Albumin: 4.5 g/dL (ref 3.8–4.8)
Alkaline Phosphatase: 100 IU/L (ref 39–117)
BUN/Creatinine Ratio: 21 (ref 10–24)
BUN: 21 mg/dL (ref 8–27)
Bilirubin Total: 0.2 mg/dL (ref 0.0–1.2)
CO2: 23 mmol/L (ref 20–29)
Calcium: 9.5 mg/dL (ref 8.6–10.2)
Chloride: 104 mmol/L (ref 96–106)
Creatinine, Ser: 1.02 mg/dL (ref 0.76–1.27)
GFR calc Af Amer: 86 mL/min/{1.73_m2} (ref >59)
GFR calc non Af Amer: 74 mL/min/{1.73_m2} (ref >59)
Globulin, Total: 2.4 g/dL (ref 1.5–4.5)
Glucose: 141 mg/dL — ABNORMAL HIGH (ref 65–99)
Potassium: 4.5 mmol/L (ref 3.5–5.2)
Sodium: 142 mmol/L (ref 134–144)
Total Protein: 6.9 g/dL (ref 6.0–8.5)

## 2019-05-08 LAB — CBC
Hematocrit: 45.7 % (ref 37.5–51.0)
Hemoglobin: 15.8 g/dL (ref 13.0–17.7)
MCH: 31.1 pg (ref 26.6–33.0)
MCHC: 34.6 g/dL (ref 31.5–35.7)
MCV: 90 fL (ref 79–97)
Platelets: 261 10*3/uL (ref 150–450)
RBC: 5.08 x10E6/uL (ref 4.14–5.80)
RDW: 12.8 % (ref 11.6–15.4)
WBC: 4.8 10*3/uL (ref 3.4–10.8)

## 2019-05-08 LAB — LIPID PANEL
Chol/HDL Ratio: 3.4 ratio (ref 0.0–5.0)
Cholesterol, Total: 171 mg/dL (ref 100–199)
HDL: 50 mg/dL (ref >39)
LDL Chol Calc (NIH): 102 mg/dL — ABNORMAL HIGH (ref 0–99)
Triglycerides: 107 mg/dL (ref 0–149)
VLDL Cholesterol Cal: 19 mg/dL (ref 5–40)

## 2019-05-08 LAB — HEMOGLOBIN A1C
Est. average glucose Bld gHb Est-mCnc: 131 mg/dL
Hgb A1c MFr Bld: 6.2 % — ABNORMAL HIGH (ref 4.8–5.6)

## 2019-06-03 ENCOUNTER — Other Ambulatory Visit: Payer: Self-pay | Admitting: Family Medicine

## 2019-07-15 DIAGNOSIS — Z1211 Encounter for screening for malignant neoplasm of colon: Secondary | ICD-10-CM | POA: Diagnosis not present

## 2019-08-07 ENCOUNTER — Encounter: Payer: Self-pay | Admitting: Family Medicine

## 2019-08-12 DIAGNOSIS — H9113 Presbycusis, bilateral: Secondary | ICD-10-CM | POA: Diagnosis not present

## 2019-08-12 DIAGNOSIS — H6123 Impacted cerumen, bilateral: Secondary | ICD-10-CM | POA: Diagnosis not present

## 2019-09-12 ENCOUNTER — Telehealth: Payer: Self-pay | Admitting: Family Medicine

## 2019-09-12 ENCOUNTER — Encounter: Payer: Self-pay | Admitting: Family Medicine

## 2019-09-12 NOTE — Telephone Encounter (Signed)
Patient states he sent in Cologuard stool sample a few months ago, but we do not have any results. Can you please call [facility] and see if they have result. Thanks.

## 2019-09-17 NOTE — Telephone Encounter (Signed)
Contacted [facility] and she said that she did not see a result for the patient or anything showing that the kit was returned. Please advise? 

## 2019-09-20 ENCOUNTER — Encounter: Payer: Self-pay | Admitting: Family Medicine

## 2019-09-22 ENCOUNTER — Other Ambulatory Visit: Payer: Self-pay

## 2019-09-22 ENCOUNTER — Telehealth: Payer: Self-pay

## 2019-09-22 DIAGNOSIS — Z1211 Encounter for screening for malignant neoplasm of colon: Secondary | ICD-10-CM

## 2019-09-22 NOTE — Telephone Encounter (Signed)
Patient agreed to have a repeat Colonoscopy instead of repeating Cologuard. Referral placed.   Dr. Caryn Section please disregard patient messages.

## 2019-09-22 NOTE — Telephone Encounter (Signed)
Copied from [place] [phone]. Topic: General - Other >> Sep 22, 2019 11:14 AM Rainey Pines A wrote: Patient stated that he was following up to his my chart message from 4/02 and last week and would like a response back. Please advise

## 2019-09-22 NOTE — Telephone Encounter (Signed)
See telephone message, pt decided to be referred for colonoscopy instead

## 2019-09-30 DIAGNOSIS — I4819 Other persistent atrial fibrillation: Secondary | ICD-10-CM | POA: Diagnosis not present

## 2019-09-30 DIAGNOSIS — E782 Mixed hyperlipidemia: Secondary | ICD-10-CM | POA: Diagnosis not present

## 2019-09-30 DIAGNOSIS — I10 Essential (primary) hypertension: Secondary | ICD-10-CM | POA: Diagnosis not present

## 2020-01-07 NOTE — Progress Notes (Signed)
Subjective:   Mark Pierce is a 69 y.o. male who presents for Medicare Annual/Subsequent preventive examination.  I connected with Marlou Porch today by telephone and verified that I am speaking with the correct person using two identifiers. Location patient: home Location provider: work Persons participating in the virtual visit: patient, provider.   I discussed the limitations, risks, security and privacy concerns of performing an evaluation and management service by telephone and the availability of in person appointments. I also discussed with the patient that there may be a patient responsible charge related to this service. The patient expressed understanding and verbally consented to this telephonic visit.    Interactive audio and video telecommunications were attempted between this provider and patient, however failed, due to patient having technical difficulties OR patient did not have access to video capability.  We continued and completed visit with audio only.   Review of Systems    N/A  Cardiac Risk Factors include: advanced age (>51men, >68 women);dyslipidemia;hypertension;male gender;sedentary lifestyle     Objective:    There were no vitals filed for this visit. There is no height or weight on file to calculate BMI.  Advanced Directives 01/08/2020 01/07/2019 09/06/2017 08/24/2016  Does Patient Have a Medical Advance Directive? Yes Yes Yes Yes  Type of Paramedic of [place];Living will [place];Living will [place];Living will [place];Living will  Copy of [place] in Chart? No - copy requested No - copy requested No - copy requested No - copy requested    Current Medications (verified) Outpatient Encounter Medications as of 01/08/2020  Medication Sig  . atorvastatin (LIPITOR) 20 MG tablet TAKE 1 TABLET BY MOUTH  DAILY  . diltiazem (CARDIZEM CD) 120 MG 24 hr  capsule Take 120 mg by mouth daily.  Marland Kitchen ELIQUIS 5 MG TABS tablet Take 5 mg by mouth 2 (two) times daily.  Marland Kitchen loratadine (CLARITIN) 10 MG tablet Take 10 mg by mouth daily.  . Multiple Vitamins-Minerals (MULTIVITAMIN ADULT PO) Take 1 tablet by mouth daily.  . sildenafil (REVATIO) 20 MG tablet 3-5 tablets as needed prior to intercourse, not to exceed 5 tablets in a day  . valsartan-hydrochlorothiazide (DIOVAN-HCT) 160-12.5 MG tablet TAKE 1 TABLET BY MOUTH  DAILY  . sildenafil (VIAGRA) 100 MG tablet Take 1 tablet (100 mg total) by mouth daily as needed for erectile dysfunction. (Patient not taking: Reported on 01/08/2020)  . tadalafil (CIALIS) 20 MG tablet Take 0.5-1 tablets (10-20 mg total) by mouth daily as needed. (Patient not taking: Reported on 05/07/2019)   No facility-administered encounter medications on file as of 01/08/2020.    Allergies (verified) Lisinopril   History: Past Medical History:  Diagnosis Date  . A-fib ([place])   . History of chicken pox   . Hyperlipidemia   . Hypertension    Past Surgical History:  Procedure Laterality Date  . APPENDECTOMY  1981  . SPINAL FUSION  2008   patient stated that he had a cervical spine infusion    Family History  Problem Relation Age of Onset  . CAD Father   . Colitis Father   . Heart attack Father   . Hyperlipidemia Brother   . Mitral valve prolapse Brother   . Heart attack Paternal Grandmother    Social History   Socioeconomic History  . Marital status: Married    Spouse name: Not on file  . Number of children: 2  . Years of education: Not  on file  . Highest education level: Bachelor's degree (e.g., BA, AB, BS)  Occupational History  . 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 Smoking status: Former Smoker    Packs/day: 1.00    Types: Cigarettes    Quit date: 06/12/1998    Years since quitting: 21.5  . Smokeless tobacco: Never Used  Vaping Use  . Vaping Use: Never  used  Substance and Sexual Activity  . Alcohol use: Yes    Alcohol/week: 21.0 standard drinks    Types: 7 Glasses of wine, 4 Cans of beer, 10 Shots of liquor per week    Comment: moderate use  . Drug use: No  . Sexual activity: Not on file  Other Topics Concern  . Not on file  Social History Narrative  . Not on file   Social Determinants of Health   Financial Resource Strain: Low Risk   . Difficulty of Paying Living Expenses: Not hard at all  Food Insecurity: No Food Insecurity  . Worried About Charity fundraiser in the Last Year: Never true  . Ran Out of Food in the Last Year: Never true  Transportation Needs: No Transportation Needs  . Lack of Transportation (Medical): No  . Lack of Transportation (Non-Medical): No  Physical Activity: Sufficiently Active  . Days of Exercise per Week: 3 days  . Minutes of Exercise per Session: 60 min  Stress: No Stress Concern Present  . Feeling of Stress : Not at all  Social Connections: Moderately Integrated  . Frequency of Communication with Friends and Family: More than three times a week  . Frequency of Social Gatherings with Friends and Family: More than three times a week  . Attends Religious Services: Never  . Active Member of Clubs or Organizations: Yes  . Attends Archivist Meetings: More than 4 times per year  . Marital Status: Married    Tobacco Counseling Counseling given: Not Answered   Clinical Intake:  Pre-visit preparation completed: Yes  Pain : No/denies pain     Nutritional Risks: None Diabetes: No  How often do you need to have someone help you when you read instructions, pamphlets, or other written materials from your doctor or pharmacy?: 1 - Never  Diabetic? No  Interpreter Needed?: No  Information entered by :: [facility], LPN   Activities of Daily Living In your present state of health, do you have any difficulty performing the following activities: 01/08/2020  Hearing? N  Comment Wears  bilateral hearing aids.  Vision? N  Comment Wears eye glasses.  Difficulty concentrating or making decisions? N  Walking or climbing stairs? N  Dressing or bathing? N  Doing errands, shopping? N  Preparing Food and eating ? N  Using the Toilet? N  In the past six months, have you accidently leaked urine? N  Do you have problems with loss of bowel control? N  Managing your Medications? N  Managing your Finances? N  Housekeeping or managing your Housekeeping? N  Some recent data might be hidden    Patient Care Team: Birdie Sons, MD as PCP - General (Family Medicine) Ubaldo Glassing Javier Docker, MD as Consulting Physician (Cardiology) Jannet Mantis, MD (Dermatology) [place], [place] any recent Medical Services you may have received from other than [facility] providers in the past year (date may be approximate).     Assessment:   This is a routine wellness  examination for Kristine.  Hearing/Vision screen No exam data present  Dietary issues and exercise activities discussed: Current Exercise Habits: Structured exercise class, Type of exercise: strength training/weights;stretching;yoga, Time (Minutes): 60, Frequency (Times/Week): 3, Weekly Exercise (Minutes/Week): 180, Intensity: Mild, Exercise limited by: None identified  Goals    . Increase water intake     Recommend increasing water intake to 3-4 glasses a day.      Depression Screen PHQ 2/9 Scores 01/08/2020 01/07/2019 09/06/2017 09/06/2017 08/24/2016 04/28/2015  PHQ - 2 Score 0 0 0 0 0 0  PHQ- 9 Score - - 0 - 0 0    Fall Risk Fall Risk  01/08/2020 01/07/2019 09/06/2017 08/24/2016  Falls in the past year? 0 0 No No  Number falls in past yr: 0 - - -  Injury with Fall? 0 - - -    Any stairs in or around the home? Yes  If so, are there any without handrails? No  Home free of loose throw rugs in walkways, pet beds, electrical cords, etc? Yes  Adequate lighting in your home to reduce risk of falls? Yes    ASSISTIVE DEVICES UTILIZED TO PREVENT FALLS:  Life alert? No  Use of a cane, walker or w/c? No  Grab bars in the bathroom? No  Shower chair or bench in shower? No  Elevated toilet seat or a handicapped toilet? Yes    Cognitive Function: Declined today.     6CIT Screen 01/07/2019 08/24/2016  What Year? 0 points 0 points  What month? 0 points 0 points  What time? 0 points 0 points  Count back from 20 0 points 0 points  Months in reverse 0 points 0 points  Repeat phrase 0 points 0 points  Total Score 0 0    Immunizations Immunization History  Administered Date(s) Administered  . H1N1 04/27/2008  . Influenza, High Dose Seasonal PF 03/11/2017, 03/21/2019  . Influenza-Unspecified 04/13/2015, 03/26/2018  . Moderna SARS-COVID-2 Vaccination 07/18/2019, 08/15/2019  . Pneumococcal Conjugate-13 10/06/2013  . Pneumococcal Polysaccharide-23 04/28/2015  . Td 06/02/2003  . Zoster 03/08/2013    TDAP status: Due, Education has been provided regarding the importance of this vaccine. Advised may receive this vaccine at local pharmacy or Health Dept. Aware to provide a copy of the vaccination record if obtained from local pharmacy or Health Dept. Verbalized acceptance and understanding. Flu Vaccine status: Up to date Pneumococcal vaccine status: Up to date Covid-19 vaccine status: Completed vaccines  Qualifies for Shingles Vaccine? Yes   Zostavax completed Yes   Shingrix Completed?: No.    Education has been provided regarding the importance of this vaccine. Patient has been advised to call insurance company to determine out of pocket expense if they have not yet received this vaccine. Advised may also receive vaccine at local pharmacy or Health Dept. Verbalized acceptance and understanding.  Screening Tests Health Maintenance  Topic Date Due  . COLONOSCOPY  07/22/2018  . TETANUS/TDAP  06/12/2026 (Originally 06/01/2013)  . INFLUENZA VACCINE  01/11/2020  . COVID-19 Vaccine  Completed   . Hepatitis C Screening  Completed  . PNA vac Low Risk Adult  Completed    Health Maintenance  Health Maintenance Due  Topic Date Due  . COLONOSCOPY  07/22/2018    Colorectal cancer screening: Currently due. Pt to contact GI office to schedule a colonoscopy for later this year. Declined referral today.  Lung Cancer Screening: (Low Dose CT Chest recommended if Age 64-80 years, 30 pack-year currently smoking OR have quit w/in 15years.)  does not qualify.   Additional Screening:  Hepatitis C Screening: Up to date  Vision Screening: Recommended annual ophthalmology exams for early detection of glaucoma and other disorders of the eye. Is the patient up to date with their annual eye exam?  No, pt states he does not need yearly eye exams but will have an exam as needed. Who is the provider or what is the name of the office in which the patient attends annual eye exams? [facility]  If pt is not established with a provider, would they like to be referred to a provider to establish care? No .   Dental Screening: Recommended annual dental exams for proper oral hygiene  Community Resource Referral / Chronic Care Management: CRR required this visit?  No   CCM required this visit?  No      Plan:     I have personally reviewed and noted the following in the patient's chart:   . Medical and social history . Use of alcohol, tobacco or illicit drugs  . Current medications and supplements . Functional ability and status . Nutritional status . Physical activity . Advanced directives . List of other physicians . Hospitalizations, surgeries, and ER visits in previous 12 months . Vitals . Screenings to include cognitive, depression, and falls . Referrals and appointments  In addition, I have reviewed and discussed with patient certain preventive protocols, quality metrics, and best practice recommendations. A written personalized care plan for preventive services as well as  general preventive health recommendations were provided to patient.     Marilyn Wing [place], [place]   [id number]   Nurse Notes: Colonoscopy due. Pt declined referral and states he will contact his GI office to schedule apt later this year.

## 2020-01-08 ENCOUNTER — Ambulatory Visit (INDEPENDENT_AMBULATORY_CARE_PROVIDER_SITE_OTHER): Payer: Medicare Other

## 2020-01-08 ENCOUNTER — Encounter: Payer: Self-pay | Admitting: Family Medicine

## 2020-01-08 ENCOUNTER — Other Ambulatory Visit: Payer: Self-pay

## 2020-01-08 DIAGNOSIS — Z0000 Encounter for general adult medical examination without abnormal findings: Secondary | ICD-10-CM

## 2020-01-08 NOTE — Patient Instructions (Signed)
[name] , Thank you for taking time to come for your Medicare Wellness Visit. I appreciate your ongoing commitment to your health goals. Please review the following plan we discussed and let me know if I can assist you in the future.   Screening reccommendations/referrals: Colonoscopy: Currently due. Pt to contact GI office to schedule a colonoscopy later this year.  Recommended yearly ophthalmology/optometry visit for glaucoma screening and checkup Recommended yearly dental visit for hygiene and checkup  Vaccinations: Influenza vaccine: Done 03/21/19 Pneumococcal vaccine: Completed series Tdap vaccine: Currently due. Shingles vaccine: Shingrix discussed. Please contact your pharmacy for coverage information.     Advanced directives: Please bring a copy of your POA (Power of Attorney) and/or Living Will to your next appointment.   Conditions/risks identified: Continue to increase water intake to 6-8 8 oz glasses a day.   Next appointment: 01/13/21 @ 2:00 PM for an AWV. Declined scheduling a follow up with PCP at this time.   Preventive Care 71 Years and Older, Male Preventive care refers to lifestyle choices and visits with your health care provider that can promote health and wellness. What does preventive care include?  A yearly physical exam. This is also called an annual well check.  Dental exams once or twice a year.  Routine eye exams. Ask your health care provider how often you should have your eyes checked.  Personal lifestyle choices, including:  Daily care of your teeth and gums.  Regular physical activity.  Eating a healthy diet.  Avoiding tobacco and drug use.  Limiting alcohol use.  Practicing safe sex.  Taking low doses of aspirin every day.  Taking vitamin and mineral supplements as recommended by your health care provider. What happens during an annual well check? The services and screenings done by your health care provider during your annual well  check will depend on your age, overall health, lifestyle risk factors, and family history of disease. Counseling  Your health care provider may ask you questions about your:  Alcohol use.  Tobacco use.  Drug use.  Emotional well-being.  Home and relationship well-being.  Sexual activity.  Eating habits.  History of falls.  Memory and ability to understand (cognition).  Work and work Statistician. Screening  You may have the following tests or measurements:  Height, weight, and BMI.  Blood pressure.  Lipid and cholesterol levels. These may be checked every 5 years, or more frequently if you are over 60 years old.  Skin check.  Lung cancer screening. You may have this screening every year starting at age 62 if you have a 30-pack-year history of smoking and currently smoke or have quit within the past 15 years.  Fecal occult blood test (FOBT) of the stool. You may have this test every year starting at age 56.  Flexible sigmoidoscopy or colonoscopy. You may have a sigmoidoscopy every 5 years or a colonoscopy every 10 years starting at age 4.  Prostate cancer screening. Recommendations will vary depending on your family history and other risks.  Hepatitis C blood test.  Hepatitis B blood test.  Sexually transmitted disease (STD) testing.  Diabetes screening. This is done by checking your blood sugar (glucose) after you have not eaten for a while (fasting). You may have this done every 1-3 years.  Abdominal aortic aneurysm (AAA) screening. You may need this if you are a current or former smoker.  Osteoporosis. You may be screened starting at age 54 if you are at high risk. Talk with your health  care provider about your test results, treatment options, and if necessary, the need for more tests. Vaccines  Your health care provider may recommend certain vaccines, such as:  Influenza vaccine. This is recommended every year.  Tetanus, diphtheria, and acellular  pertussis (Tdap, Td) vaccine. You may need a Td booster every 10 years.  Zoster vaccine. You may need this after age 75.  Pneumococcal 13-valent conjugate (PCV13) vaccine. One dose is recommended after age 17.  Pneumococcal polysaccharide (PPSV23) vaccine. One dose is recommended after age 51. Talk to your health care provider about which screenings and vaccines you need and how often you need them. This information is not intended to replace advice given to you by your health care provider. Make sure you discuss any questions you have with your health care provider. Document Released: 06/25/2015 Document Revised: 02/16/2016 Document Reviewed: 03/30/2015 Elsevier Interactive Patient Education  2017 [place] Prevention in the Home Falls can cause injuries. They can happen to people of all ages. There are many things you can do to make your home safe and to help prevent falls. What can I do on the outside of my home?  Regularly fix the edges of walkways and driveways and fix any cracks.  Remove anything that might make you trip as you walk through a door, such as a raised step or threshold.  Trim any bushes or trees on the path to your home.  Use bright outdoor lighting.  Clear any walking paths of anything that might make someone trip, such as rocks or tools.  Regularly check to see if handrails are loose or broken. Make sure that both sides of any steps have handrails.  Any raised decks and porches should have guardrails on the edges.  Have any leaves, snow, or ice cleared regularly.  Use sand or salt on walking paths during winter.  Clean up any spills in your garage right away. This includes oil or grease spills. What can I do in the bathroom?  Use night lights.  Install grab bars by the toilet and in the tub and shower. Do not use towel bars as grab bars.  Use non-skid mats or decals in the tub or shower.  If you need to sit down in the shower, use a plastic,  non-slip stool.  Keep the floor dry. Clean up any water that spills on the floor as soon as it happens.  Remove soap buildup in the tub or shower regularly.  Attach bath mats securely with double-sided non-slip rug tape.  Do not have throw rugs and other things on the floor that can make you trip. What can I do in the bedroom?  Use night lights.  Make sure that you have a light by your bed that is easy to reach.  Do not use any sheets or blankets that are too big for your bed. They should not hang down onto the floor.  Have a firm chair that has side arms. You can use this for support while you get dressed.  Do not have throw rugs and other things on the floor that can make you trip. What can I do in the kitchen?  Clean up any spills right away.  Avoid walking on wet floors.  Keep items that you use a lot in easy-to-reach places.  If you need to reach something above you, use a strong step stool that has a grab bar.  Keep electrical cords out of the way.  Do not use floor  polish or wax that makes floors slippery. If you must use wax, use non-skid floor wax.  Do not have throw rugs and other things on the floor that can make you trip. What can I do with my stairs?  Do not leave any items on the stairs.  Make sure that there are handrails on both sides of the stairs and use them. Fix handrails that are broken or loose. Make sure that handrails are as long as the stairways.  Check any carpeting to make sure that it is firmly attached to the stairs. Fix any carpet that is loose or worn.  Avoid having throw rugs at the top or bottom of the stairs. If you do have throw rugs, attach them to the floor with carpet tape.  Make sure that you have a light switch at the top of the stairs and the bottom of the stairs. If you do not have them, ask someone to add them for you. What else can I do to help prevent falls?  Wear shoes that:  Do not have high heels.  Have rubber  bottoms.  Are comfortable and fit you well.  Are closed at the toe. Do not wear sandals.  If you use a stepladder:  Make sure that it is fully opened. Do not climb a closed stepladder.  Make sure that both sides of the stepladder are locked into place.  Ask someone to hold it for you, if possible.  Clearly mark and make sure that you can see:  Any grab bars or handrails.  First and last steps.  Where the edge of each step is.  Use tools that help you move around (mobility aids) if they are needed. These include:  Canes.  Walkers.  Scooters.  Crutches.  Turn on the lights when you go into a dark area. Replace any light bulbs as soon as they burn out.  Set up your furniture so you have a clear path. Avoid moving your furniture around.  If any of your floors are uneven, fix them.  If there are any pets around you, be aware of where they are.  Review your medicines with your doctor. Some medicines can make you feel dizzy. This can increase your chance of falling. Ask your doctor what other things that you can do to help prevent falls. This information is not intended to replace advice given to you by your health care provider. Make sure you discuss any questions you have with your health care provider. Document Released: 03/25/2009 Document Revised: 11/04/2015 Document Reviewed: 07/03/2014 Elsevier Interactive Patient Education  2017 [employer].

## 2020-01-31 ENCOUNTER — Encounter: Payer: Self-pay | Admitting: Family Medicine

## 2020-03-21 ENCOUNTER — Encounter: Payer: Self-pay | Admitting: Family Medicine

## 2020-03-22 ENCOUNTER — Other Ambulatory Visit: Payer: Self-pay | Admitting: Family Medicine

## 2020-03-22 MED ORDER — SILDENAFIL CITRATE 20 MG PO TABS: ORAL_TABLET | ORAL | 6 refills | Status: DC

## 2020-03-26 ENCOUNTER — Telehealth: Payer: Self-pay

## 2020-03-26 NOTE — Telephone Encounter (Signed)
Opened in error

## 2020-04-15 ENCOUNTER — Ambulatory Visit: Payer: Medicare Other

## 2020-04-25 ENCOUNTER — Other Ambulatory Visit: Payer: Self-pay | Admitting: Family Medicine

## 2020-04-26 NOTE — Telephone Encounter (Signed)
Requested medication (s) are due for refill today:yes  Requested medication (s) are on the active medication list: {yes  Last refill:  06/03/19  #90  3 refills  Future visit scheduled: yes CPE scheduled 06/29/20  Notes to clinic:  Labs Out of date.  Called and scheduled patient for CPE first available 06/29/20. Please review medications and labs    Requested Prescriptions  Pending Prescriptions Disp Refills   atorvastatin (LIPITOR) 20 MG tablet [Pharmacy Med Name: Atorvastatin Calcium 20 MG Oral Tablet] 90 tablet 3    Sig: TAKE 1 TABLET BY MOUTH  DAILY      Cardiovascular:  Antilipid - Statins Failed - 04/25/2020  9:48 PM      Failed - LDL in normal range and within 360 days    LDL Chol Calc (NIH)  Date Value Ref Range Status  05/07/2019 102 (H) 0 - 99 mg/dL Final          Failed - Valid encounter within last 12 months    Recent Outpatient Visits           11 months ago Annual physical exam   [facility] Birdie Sons, MD   2 years ago Chronic atrial fibrillation [facility])   [facility] Birdie Sons, MD   3 years ago Annual physical exam   [facility] Birdie Sons, MD   5 years ago Prediabetes   [facility] Birdie Sons, MD       Future Appointments             In 2 months Fisher, Kirstie Peri, MD [facility]            Passed - Total Cholesterol in normal range and within 360 days    Cholesterol, Total  Date Value Ref Range Status  05/07/2019 171 100 - 199 mg/dL Final          Passed - HDL in normal range and within 360 days    HDL  Date Value Ref Range Status  05/07/2019 50 >39 mg/dL Final          Passed - Triglycerides in normal range and within 360 days    Triglycerides  Date Value Ref Range Status  05/07/2019 107 0 - 149 mg/dL Final          Passed - Patient is not pregnant        valsartan-hydrochlorothiazide (DIOVAN-HCT) 160-12.5 MG tablet [Pharmacy  Med Name: Valsartan-hydroCHLOROthiazide 160-12.5 MG Oral Tablet] 90 tablet 3    Sig: TAKE 1 TABLET BY MOUTH  DAILY      Cardiovascular: ARB + Diuretic Combos Failed - 04/25/2020  9:48 PM      Failed - K in normal range and within 180 days    Potassium  Date Value Ref Range Status  05/07/2019 4.5 3.5 - 5.2 mmol/L Final          Failed - Na in normal range and within 180 days    Sodium  Date Value Ref Range Status  05/07/2019 142 134 - 144 mmol/L Final          Failed - Cr in normal range and within 180 days    Creatinine, Ser  Date Value Ref Range Status  05/07/2019 1.02 0.76 - 1.27 mg/dL Final          Failed - Ca in normal range and within 180 days    Calcium  Date Value Ref Range Status  05/07/2019 9.5 8.6 - 10.2 mg/dL Final          Failed - Valid encounter within last 6 months    Recent Outpatient Visits           11 months ago Annual physical exam   [facility] Birdie Sons, MD   2 years ago Chronic atrial fibrillation [facility])   [facility] Birdie Sons, MD   3 years ago Annual physical exam   [facility] Birdie Sons, MD   5 years ago Prediabetes   [facility] Birdie Sons, MD       Future Appointments             In 2 months Fisher, Kirstie Peri, MD [facility] [place] - Patient is not pregnant      Passed - Last BP in normal range    BP Readings from Last 1 Encounters:  05/07/19 122/70

## 2020-04-30 DIAGNOSIS — Z7901 Long term (current) use of anticoagulants: Secondary | ICD-10-CM | POA: Diagnosis not present

## 2020-04-30 DIAGNOSIS — Z1211 Encounter for screening for malignant neoplasm of colon: Secondary | ICD-10-CM | POA: Diagnosis not present

## 2020-06-29 ENCOUNTER — Ambulatory Visit (INDEPENDENT_AMBULATORY_CARE_PROVIDER_SITE_OTHER): Payer: Medicare Other | Admitting: Family Medicine

## 2020-06-29 ENCOUNTER — Other Ambulatory Visit: Payer: Self-pay

## 2020-06-29 ENCOUNTER — Encounter: Payer: Self-pay | Admitting: Family Medicine

## 2020-06-29 VITALS — BP 124/76 | HR 80 | Temp 97.5°F | Resp 16 | Ht 69.0 in | Wt 197.0 lb

## 2020-06-29 DIAGNOSIS — Z289 Immunization not carried out for unspecified reason: Secondary | ICD-10-CM | POA: Diagnosis not present

## 2020-06-29 DIAGNOSIS — Z125 Encounter for screening for malignant neoplasm of prostate: Secondary | ICD-10-CM

## 2020-06-29 DIAGNOSIS — Z23 Encounter for immunization: Secondary | ICD-10-CM | POA: Diagnosis not present

## 2020-06-29 DIAGNOSIS — E7800 Pure hypercholesterolemia, unspecified: Secondary | ICD-10-CM | POA: Diagnosis not present

## 2020-06-29 DIAGNOSIS — I10 Essential (primary) hypertension: Secondary | ICD-10-CM | POA: Diagnosis not present

## 2020-06-29 DIAGNOSIS — Z0000 Encounter for general adult medical examination without abnormal findings: Secondary | ICD-10-CM | POA: Diagnosis not present

## 2020-06-29 DIAGNOSIS — R7303 Prediabetes: Secondary | ICD-10-CM | POA: Diagnosis not present

## 2020-06-29 DIAGNOSIS — I4819 Other persistent atrial fibrillation: Secondary | ICD-10-CM | POA: Diagnosis not present

## 2020-06-29 MED ORDER — SHINGRIX 50 MCG/0.5ML IM SUSR: 0.5000 mL | Freq: Once | INTRAMUSCULAR | 0 refills | Status: AC

## 2020-06-29 MED ORDER — TETANUS-DIPHTH-ACELL PERTUSSIS 5-2.5-18.5 LF-MCG/0.5 IM SUSY: 0.5000 mL | PREFILLED_SYRINGE | Freq: Once | INTRAMUSCULAR | 0 refills | Status: AC

## 2020-06-29 NOTE — Patient Instructions (Addendum)
.   Please review the attached list of medications and notify my office if there are any errors.   . Don't forget to reschedule the colonoscopy in the spring of 2022

## 2020-06-29 NOTE — Progress Notes (Signed)
Complete physical exam   Patient: Mark Pierce   DOB: March 18, 1951   70 y.o. Male  MRN: [id number] Visit Date: 06/29/2020  Today's healthcare provider: Lelon Huh, MD   Chief Complaint  Patient presents with  . Annual Exam  . Hypertension  . Hyperlipidemia  . Prediabetes   Subjective    Mark Pierce is a 70 y.o. male who presents today for a complete physical exam.  He reports consuming a general diet. Gym/ health club routine includes yoga and exercise class. He generally feels fairly well. He reports sleeping fairly well. He does not have additional problems to discuss today.   Had AWV with [facility] on 01/08/2020.   HPI  Hypertension, follow-up  BP Readings from Last 3 Encounters:  06/29/20 124/76  05/07/19 122/70  11/22/17 110/64   Wt Readings from Last 3 Encounters:  06/29/20 197 lb (89.4 kg)  05/07/19 195 lb (88.5 kg)  11/22/17 190 lb (86.2 kg)     He was last seen for hypertension 05/07/2019.   BP at that visit was 122/70. Management since that visit includes continue same medications.  He reports good compliance with treatment. He is not having side effects.  He is following a Regular diet. He is exercising. He does not smoke.  Use of agents associated with hypertension: none.   Outside blood pressures are not checked. Symptoms: No chest pain No chest pressure  No palpitations No syncope  No dyspnea No orthopnea  No paroxysmal nocturnal dyspnea No lower extremity edema    The 10-year ASCVD risk score Mikey Bussing DC Jr., et al., 2013) is: 19.7%   ---------------------------------------------------------------------------------------------------  Lipid/Cholesterol, Follow-up  Last lipid panel Other pertinent labs  Lab Results  Component Value Date   CHOL 171 05/07/2019   HDL 50 05/07/2019   LDLCALC 102 (H) 05/07/2019   TRIG 107 05/07/2019   CHOLHDL 3.4 05/07/2019   Lab Results  Component Value Date   ALT 34 05/07/2019   AST 25 05/07/2019    PLT 261 05/07/2019   TSH 1.390 11/23/2017     He was last seen for this on 05/07/2019.   Management since that visit includes continuing same medications.  He reports good compliance with treatment. He is not having side effects.   Symptoms: No chest pain No chest pressure/discomfort  No dyspnea No lower extremity edema  No numbness or tingling of extremity No orthopnea  No palpitations No paroxysmal nocturnal dyspnea  No speech difficulty No syncope   Current diet: well balanced Current exercise: yoga  The 10-year ASCVD risk score Mikey Bussing DC Jr., et al., 2013) is: 19.7%  ---------------------------------------------------------------------------------------------------  Prediabetes, Follow-up  Lab Results  Component Value Date   HGBA1C 6.2 (H) 05/07/2019   HGBA1C 6.0 (H) 11/23/2017   HGBA1C 6.0 (H) 08/24/2016   GLUCOSE 141 (H) 05/07/2019   GLUCOSE 132 (H) 11/23/2017   GLUCOSE 139 (H) 08/24/2016    Last seen for for this on 05/07/2019.  Management since that visit includes advising patient to avoid sweets and starchy foods. Current symptoms include none and have been stable.  Prior visit with dietician: no Current diet: well balanced Current exercise: yoga  Pertinent Labs:    Component Value Date/Time   CHOL 171 05/07/2019 0941   TRIG 107 05/07/2019 0941   CHOLHDL 3.4 05/07/2019 0941   CREATININE 1.02 05/07/2019 0941    Wt Readings from Last 3 Encounters:  06/29/20 197 lb (89.4 kg)  05/07/19 195 lb (88.5 kg)  11/22/17  190 lb (86.2 kg)    -----------------------------------------------------------------------------------------  Follow up for atrial fibrillation:  The patient was last seen for this on 05/07/2019.  Changes made at last visit include none; continue same medications.  He reports good compliance with treatment. He continues regular follow up with Dr. Ubaldo Glassing. Does not have any sx with a-fib.  He feels that condition is Unchanged. He is not  having side effects.   -----------------------------------------------------------------------------------------   Past Medical History:  Diagnosis Date  . A-fib ([place])   . History of chicken pox   . Hyperlipidemia   . Hypertension    Past Surgical History:  Procedure Laterality Date  . APPENDECTOMY  1981  . SPINAL FUSION  2008   patient stated that he had a cervical spine infusion    Social History   Socioeconomic History  . Marital status: Married    Spouse name: Not on file  . Number of children: 2  . Years of education: Not on file  . Highest education level: Bachelor's degree (e.g., BA, AB, BS)  Occupational History  . 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 Smoking status: Former Smoker    Packs/day: 1.00    Types: Cigarettes    Quit date: 06/12/1998    Years since quitting: 22.0  . Smokeless tobacco: Never Used  Vaping Use  . Vaping Use: Never used  Substance and Sexual Activity  . Alcohol use: Yes    Alcohol/week: 21.0 standard drinks    Types: 7 Glasses of wine, 4 Cans of beer, 10 Shots of liquor per week    Comment: moderate use  . Drug use: No  . Sexual activity: Not on file  Other Topics Concern  . Not on file  Social History Narrative  . Not on file   Social Determinants of Health   Financial Resource Strain: Low Risk   . Difficulty of Paying Living Expenses: Not hard at all  Food Insecurity: No Food Insecurity  . Worried About Charity fundraiser in the Last Year: Never true  . Ran Out of Food in the Last Year: Never true  Transportation Needs: No Transportation Needs  . Lack of Transportation (Medical): No  . Lack of Transportation (Non-Medical): No  Physical Activity: Sufficiently Active  . Days of Exercise per Week: 3 days  . Minutes of Exercise per Session: 60 min  Stress: No Stress Concern Present  . Feeling of Stress : Not at all  Social Connections: Moderately Integrated  .  Frequency of Communication with Friends and Family: More than three times a week  . Frequency of Social Gatherings with Friends and Family: More than three times a week  . Attends Religious Services: Never  . Active Member of Clubs or Organizations: Yes  . Attends Archivist Meetings: More than 4 times per year  . Marital Status: Married  Human resources officer Violence: Not At Risk  . Fear of Current or Ex-Partner: No  . Emotionally Abused: No  . Physically Abused: No  . Sexually Abused: No   Family Status  Relation Name Status  . Mother  Deceased       has colorectal adenoma, natural causes for death  . Father  Deceased       also had Colorectal Adenoma and had Bypass  . Brother  Alive  . Son  Alive  . Son  Alive  . PGM  (Not Specified)   Family  History  Problem Relation Age of Onset  . CAD Father   . Colitis Father   . Heart attack Father   . Hyperlipidemia Brother   . Mitral valve prolapse Brother   . Heart attack Paternal Grandmother    Allergies  Allergen Reactions  . Lisinopril Cough    dry cough dry cough    Patient Care Team: Malva Limes, MD as PCP - General (Family Medicine) Lady Gary Darlin Priestly, MD as Consulting Physician (Cardiology) Wanita Chamberlain, MD (Dermatology) Pa, [facility]   Medications: Outpatient Medications Prior to Visit  Medication Sig  . atorvastatin (LIPITOR) 20 MG tablet TAKE 1 TABLET BY MOUTH  DAILY  . diltiazem (CARDIZEM CD) 120 MG 24 hr capsule Take 120 mg by mouth daily.  Marland Kitchen ELIQUIS 5 MG TABS tablet Take 5 mg by mouth 2 (two) times daily.  Marland Kitchen loratadine (CLARITIN) 10 MG tablet Take 10 mg by mouth daily.  . Multiple Vitamins-Minerals (MULTIVITAMIN ADULT PO) Take 1 tablet by mouth daily.  . sildenafil (REVATIO) 20 MG tablet 3-5 tablets as needed prior to intercourse, not to exceed 5 tablets in a day  . sildenafil (VIAGRA) 100 MG tablet Take 1 tablet (100 mg total) by mouth daily as needed for erectile dysfunction.   . valsartan-hydrochlorothiazide (DIOVAN-HCT) 160-12.5 MG tablet TAKE 1 TABLET BY MOUTH  DAILY  . [DISCONTINUED] tadalafil (CIALIS) 20 MG tablet Take 0.5-1 tablets (10-20 mg total) by mouth daily as needed. (Patient not taking: Reported on 05/07/2019)   No facility-administered medications prior to visit.    Review of Systems  Constitutional: Negative for appetite change, chills, fatigue and fever.  HENT: Negative for congestion, ear pain, hearing loss, nosebleeds and trouble swallowing.   Eyes: Negative for pain and visual disturbance.  Respiratory: Negative for cough, chest tightness and shortness of breath.   Cardiovascular: Negative for chest pain, palpitations and leg swelling.  Gastrointestinal: Negative for abdominal pain, blood in stool, constipation, diarrhea, nausea and vomiting.  Endocrine: Negative for polydipsia, polyphagia and polyuria.  Genitourinary: Negative for dysuria and flank pain.  Musculoskeletal: Negative for arthralgias, back pain, joint swelling, myalgias and neck stiffness.  Skin: Negative for color change, rash and wound.  Neurological: Negative for dizziness, tremors, seizures, speech difficulty, weakness, light-headedness and headaches.  Psychiatric/Behavioral: Negative for behavioral problems, confusion, decreased concentration, dysphoric mood and sleep disturbance. The patient is not nervous/anxious.   All other systems reviewed and are negative.     Objective    BP 124/76 (BP Location: Right Arm, Patient Position: Sitting, Cuff Size: Large)   Pulse 80   Temp (!) 97.5 F (36.4 C) (Temporal)   Resp 16   Ht 5\' 9"  (1.753 m)   Wt 197 lb (89.4 kg)   SpO2 97% Comment: room air  BMI 29.09 kg/m    Physical Exam   General Appearance:     Well developed, well nourished male. Alert, cooperative, in no acute distress, appears stated age  Head:    Normocephalic, without obvious abnormality, atraumatic  Eyes:    PERRL, conjunctiva/corneas clear, EOM's  intact, fundi    benign, both eyes       Ears:    Normal TM's and external ear canals, both ears  Neck:   Supple, symmetrical, trachea midline, no adenopathy;       thyroid:  No enlargement/tenderness/nodules; no carotid   bruit or JVD  Back:     Symmetric, no curvature, ROM normal, no CVA tenderness  Lungs:  Clear to auscultation bilaterally, respirations unlabored  Chest wall:    No tenderness or deformity  Heart:    Normal heart rate. Irregularly irregular rhythm. No murmurs, rubs, or gallops.  S1 and S2 normal  Abdomen:     Soft, non-tender, bowel sounds active all four quadrants,    no masses, no organomegaly  Genitalia:    deferred  Rectal:    deferred  Extremities:   All extremities are intact. No cyanosis or edema  Pulses:   2+ and symmetric all extremities  Skin:   Skin color, texture, turgor normal, no rashes or lesions  Lymph nodes:   Cervical, supraclavicular, and axillary nodes normal  Neurologic:   CNII-XII intact. Normal strength, sensation and reflexes      throughout     Last depression screening scores PHQ 2/9 Scores 01/08/2020 01/07/2019 09/06/2017  PHQ - 2 Score 0 0 0  PHQ- 9 Score - - 0   Last fall risk screening Fall Risk  01/08/2020  Falls in the past year? 0  Number falls in past yr: 0  Injury with Fall? 0   Last Audit-C alcohol use screening Alcohol Use Disorder Test (AUDIT) 01/08/2020  1. How often do you have a drink containing alcohol? 4  2. How many drinks containing alcohol do you have on a typical day when you are drinking? 0  3. How often do you have six or more drinks on one occasion? 0  AUDIT-C Score 4  4. How often during the last year have you found that you were not able to stop drinking once you had started? 0  5. How often during the last year have you failed to do what was normally expected from you because of drinking? 0  6. How often during the last year have you needed a first drink in the morning to get yourself going after a heavy  drinking session? 0  7. How often during the last year have you had a feeling of guilt of remorse after drinking? 0  8. How often during the last year have you been unable to remember what happened the night before because you had been drinking? 0  9. Have you or someone else been injured as a result of your drinking? 0  10. Has a relative or friend or a doctor or another health worker been concerned about your drinking or suggested you cut down? 4  Alcohol Use Disorder Identification Test Final Score (AUDIT) 8  Alcohol Brief Interventions/Follow-up Patient Refused;Brief Advice   A score of 3 or more in women, and 4 or more in men indicates increased risk for alcohol abuse, EXCEPT if all of the points are from question 1   No results found for any visits on 06/29/20.  Assessment & Plan    Routine Health Maintenance and Physical Exam  Exercise Activities and Dietary recommendations Goals    . Increase water intake     Recommend increasing water intake to 3-4 glasses a day.       Immunization History  Administered Date(s) Administered  . H1N1 04/27/2008  . Influenza, High Dose Seasonal PF 03/11/2017, 03/21/2019, 04/26/2020  . Influenza-Unspecified 04/13/2015, 03/26/2018  . Moderna Sars-Covid-2 Vaccination 07/18/2019, 08/15/2019, 04/06/2020  . Pneumococcal Conjugate-13 10/06/2013  . Pneumococcal Polysaccharide-23 04/28/2015  . Td 06/02/2003  . Zoster 03/08/2013    Health Maintenance  Topic Date Due  . Samul Dada  06/01/2013  . COLONOSCOPY (Pts 45-56yrs Insurance coverage will need to be confirmed)  07/22/2018  .  INFLUENZA VACCINE  Completed  . COVID-19 Vaccine  Completed  . Hepatitis C Screening  Completed  . PNA vac Low Risk Adult  Completed    Discussed health benefits of physical activity, and encouraged him to engage in regular exercise appropriate for his age and condition.  1. Persistent atrial fibrillation (HCC) Rate well controlled, doing well on NOAC. Up to  date on cardiology follow up   2. Prediabetes  - Hemoglobin A1c  3. Essential (primary) hypertension Well controlled.  Continue current medications.    4. Pure hypercholesterolemia He is tolerating atorvastatin well with no adverse effects.   - CBC - Comprehensive metabolic panel - Lipid panel  5. Prostate cancer screening  - PSA Total (Reflex To Free) ([facility] only)  6. Prescription for Shingrix. Vaccine not administered in office.   - Zoster Vaccine Adjuvanted [facility]) injection; Inject 0.5 mLs into the muscle once for 1 dose.  Dispense: 0.5 mL; Refill: 0  7. Prescription for Tdap. Vaccine not administered in office.   - Tdap ([place]) 5-2.5-18.5 LF-MCG/0.5 injection; Inject 0.5 mLs into the muscle once for 1 dose.  Dispense: 0.5 mL; Refill: 0  8. Annual physical exam He had to cancel scheduled colonoscopy due to Covid surge and plans on calling GI to reschedule in April  - CBC - Comprehensive metabolic panel - Lipid panel        The entirety of the information documented in the History of Present Illness, Review of Systems and Physical Exam were personally obtained by me. Portions of this information were initially documented by the CMA and reviewed by me for thoroughness and accuracy.      Lelon Huh, MD  [facility] [phone] (phone) [phone] (fax)  [place]

## 2020-06-30 DIAGNOSIS — E7800 Pure hypercholesterolemia, unspecified: Secondary | ICD-10-CM | POA: Diagnosis not present

## 2020-06-30 DIAGNOSIS — R7303 Prediabetes: Secondary | ICD-10-CM | POA: Diagnosis not present

## 2020-06-30 DIAGNOSIS — Z0000 Encounter for general adult medical examination without abnormal findings: Secondary | ICD-10-CM | POA: Diagnosis not present

## 2020-07-01 LAB — HEMOGLOBIN A1C
Est. average glucose Bld gHb Est-mCnc: 137 mg/dL
Hgb A1c MFr Bld: 6.4 % — ABNORMAL HIGH (ref 4.8–5.6)

## 2020-07-01 LAB — CBC
Hematocrit: 45.3 % (ref 37.5–51.0)
Hemoglobin: 15.8 g/dL (ref 13.0–17.7)
MCH: 31.3 pg (ref 26.6–33.0)
MCHC: 34.9 g/dL (ref 31.5–35.7)
MCV: 90 fL (ref 79–97)
Platelets: 259 10*3/uL (ref 150–450)
RBC: 5.04 x10E6/uL (ref 4.14–5.80)
RDW: 12.8 % (ref 11.6–15.4)
WBC: 5.2 10*3/uL (ref 3.4–10.8)

## 2020-07-01 LAB — COMPREHENSIVE METABOLIC PANEL
ALT: 33 IU/L (ref 0–44)
AST: 23 IU/L (ref 0–40)
Albumin/Globulin Ratio: 1.7 (ref 1.2–2.2)
Albumin: 4.3 g/dL (ref 3.7–4.7)
Alkaline Phosphatase: 98 IU/L (ref 44–121)
BUN/Creatinine Ratio: 20 (ref 10–24)
BUN: 22 mg/dL (ref 8–27)
Bilirubin Total: 0.4 mg/dL (ref 0.0–1.2)
CO2: 25 mmol/L (ref 20–29)
Calcium: 9.5 mg/dL (ref 8.6–10.2)
Chloride: 104 mmol/L (ref 96–106)
Creatinine, Ser: 1.08 mg/dL (ref 0.76–1.27)
GFR calc Af Amer: 79 mL/min/{1.73_m2} (ref >59)
GFR calc non Af Amer: 69 mL/min/{1.73_m2} (ref >59)
Globulin, Total: 2.6 g/dL (ref 1.5–4.5)
Glucose: 135 mg/dL — ABNORMAL HIGH (ref 65–99)
Potassium: 4.6 mmol/L (ref 3.5–5.2)
Sodium: 141 mmol/L (ref 134–144)
Total Protein: 6.9 g/dL (ref 6.0–8.5)

## 2020-07-01 LAB — PSA TOTAL (REFLEX TO FREE): Prostate Specific Ag, Serum: 3 ng/mL (ref 0.0–4.0)

## 2020-07-01 LAB — LIPID PANEL
Chol/HDL Ratio: 3.4 ratio (ref 0.0–5.0)
Cholesterol, Total: 174 mg/dL (ref 100–199)
HDL: 51 mg/dL (ref >39)
LDL Chol Calc (NIH): 104 mg/dL — ABNORMAL HIGH (ref 0–99)
Triglycerides: 102 mg/dL (ref 0–149)
VLDL Cholesterol Cal: 19 mg/dL (ref 5–40)

## 2020-07-06 ENCOUNTER — Ambulatory Visit: Admit: 2020-07-06 | Payer: Self-pay

## 2020-07-06 SURGERY — COLONOSCOPY
Anesthesia: General

## 2020-08-21 ENCOUNTER — Encounter: Payer: Self-pay | Admitting: Family Medicine

## 2020-08-23 NOTE — Telephone Encounter (Signed)
Pt is inquiring about this form/ FU [phone]

## 2020-08-24 ENCOUNTER — Ambulatory Visit: Payer: Self-pay | Admitting: Family Medicine

## 2020-09-21 DIAGNOSIS — L0211 Cutaneous abscess of neck: Secondary | ICD-10-CM | POA: Diagnosis not present

## 2020-10-07 ENCOUNTER — Other Ambulatory Visit: Payer: Self-pay | Admitting: Family Medicine

## 2020-10-07 NOTE — Telephone Encounter (Signed)
Approved per protocol.  Requested Prescriptions  Pending Prescriptions Disp Refills  . valsartan-hydrochlorothiazide (DIOVAN-HCT) 160-12.5 MG tablet [Pharmacy Med Name: Valsartan-hydroCHLOROthiazide 160-12.5 MG Oral Tablet] 90 tablet 3    Sig: TAKE 1 TABLET BY MOUTH  DAILY     Cardiovascular: ARB + Diuretic Combos Passed - 10/07/2020  9:58 PM      Passed - K in normal range and within 180 days    Potassium  Date Value Ref Range Status  06/30/2020 4.6 3.5 - 5.2 mmol/L Final         Passed - Na in normal range and within 180 days    Sodium  Date Value Ref Range Status  06/30/2020 141 134 - 144 mmol/L Final         Passed - Cr in normal range and within 180 days    Creatinine, Ser  Date Value Ref Range Status  06/30/2020 1.08 0.76 - 1.27 mg/dL Final         Passed - Ca in normal range and within 180 days    Calcium  Date Value Ref Range Status  06/30/2020 9.5 8.6 - 10.2 mg/dL Final         Passed - Patient is not pregnant      Passed - Last BP in normal range    BP Readings from Last 1 Encounters:  06/29/20 124/76         Passed - Valid encounter within last 6 months    Recent Outpatient Visits          3 months ago Annual physical exam   [facility] Birdie Sons, MD   1 year ago Annual physical exam   [facility] Birdie Sons, MD   2 years ago Chronic atrial fibrillation [facility])   [facility] Birdie Sons, MD   4 years ago Annual physical exam   [facility] Birdie Sons, MD   5 years ago [place], Donald E, MD             . atorvastatin (LIPITOR) 20 MG tablet [Pharmacy Med Name: Atorvastatin Calcium 20 MG Oral Tablet] 90 tablet 3    Sig: TAKE 1 TABLET BY MOUTH  DAILY     Cardiovascular:  Antilipid - Statins Failed - 10/07/2020  9:58 PM      Failed - LDL in normal range and within 360 days    LDL Chol Calc (NIH)  Date Value Ref Range Status   06/30/2020 104 (H) 0 - 99 mg/dL Final         Passed - Total Cholesterol in normal range and within 360 days    Cholesterol, Total  Date Value Ref Range Status  06/30/2020 174 100 - 199 mg/dL Final         Passed - HDL in normal range and within 360 days    HDL  Date Value Ref Range Status  06/30/2020 51 >39 mg/dL Final         Passed - Triglycerides in normal range and within 360 days    Triglycerides  Date Value Ref Range Status  06/30/2020 102 0 - 149 mg/dL Final         Passed - Patient is not pregnant      Passed - Valid encounter within last 12 months    Recent Outpatient Visits          3 months ago Annual physical exam  [facility] Caryn Section, Kirstie Peri, MD   1 year ago Annual physical exam   [facility] Birdie Sons, MD   2 years ago Chronic atrial fibrillation [facility])   [facility] Birdie Sons, MD   4 years ago Annual physical exam   [facility] Birdie Sons, MD   5 years ago [place], Donald E, MD

## 2020-11-08 ENCOUNTER — Other Ambulatory Visit: Payer: Self-pay | Admitting: Dermatology

## 2020-12-25 ENCOUNTER — Encounter: Payer: Self-pay | Admitting: Family Medicine

## 2020-12-25 DIAGNOSIS — U071 COVID-19: Secondary | ICD-10-CM

## 2020-12-28 MED ORDER — MOLNUPIRAVIR EUA 200MG CAPSULE: 4.0000 | ORAL_CAPSULE | Freq: Two times a day (BID) | ORAL | 0 refills | Status: AC

## 2021-01-21 DIAGNOSIS — H6120 Impacted cerumen, unspecified ear: Secondary | ICD-10-CM | POA: Diagnosis not present

## 2021-01-21 DIAGNOSIS — R030 Elevated blood-pressure reading, without diagnosis of hypertension: Secondary | ICD-10-CM | POA: Diagnosis not present

## 2021-02-15 ENCOUNTER — Encounter: Payer: Self-pay | Admitting: Family Medicine

## 2021-02-21 DIAGNOSIS — I10 Essential (primary) hypertension: Secondary | ICD-10-CM | POA: Diagnosis not present

## 2021-02-21 DIAGNOSIS — E782 Mixed hyperlipidemia: Secondary | ICD-10-CM | POA: Diagnosis not present

## 2021-02-21 DIAGNOSIS — I4819 Other persistent atrial fibrillation: Secondary | ICD-10-CM | POA: Diagnosis not present

## 2021-02-22 ENCOUNTER — Telehealth: Payer: Self-pay

## 2021-02-22 NOTE — Progress Notes (Signed)
Left message for patient to call back and schedule Medicare Annual Wellness Visit (AWV) on  Saturday Sept 24,2022 to do by phone or video     Last AWV 01/08/2020  Please schedule with [facility]Nurse Health Advisor.       60 Minutes appointment    Any questions, please call me at  Noreene Larsson, [address], [place], [place] [address] Direct Dial: [phone] [email or web address] Website: [email or web address]

## 2021-03-24 ENCOUNTER — Other Ambulatory Visit: Payer: Self-pay | Admitting: Family Medicine

## 2021-03-24 NOTE — Telephone Encounter (Signed)
Patient will need an office visit for further refills. Requested Prescriptions  Pending Prescriptions Disp Refills  . valsartan-hydrochlorothiazide (DIOVAN-HCT) 160-12.5 MG tablet [Pharmacy Med Name: Valsartan-hydroCHLOROthiazide 160-12.5 MG Oral Tablet] 30 tablet 0    Sig: TAKE 1 TABLET BY MOUTH  DAILY     Cardiovascular: ARB + Diuretic Combos Failed - 03/24/2021 12:29 AM      Failed - K in normal range and within 180 days    Potassium  Date Value Ref Range Status  06/30/2020 4.6 3.5 - 5.2 mmol/L Final         Failed - Na in normal range and within 180 days    Sodium  Date Value Ref Range Status  06/30/2020 141 134 - 144 mmol/L Final         Failed - Cr in normal range and within 180 days    Creatinine, Ser  Date Value Ref Range Status  06/30/2020 1.08 0.76 - 1.27 mg/dL Final         Failed - Ca in normal range and within 180 days    Calcium  Date Value Ref Range Status  06/30/2020 9.5 8.6 - 10.2 mg/dL Final         Failed - Valid encounter within last 6 months    Recent Outpatient Visits          8 months ago Annual physical exam   [facility] Birdie Sons, MD   1 year ago Annual physical exam   [facility] Birdie Sons, MD   3 years ago Chronic atrial fibrillation [facility])   [facility] Birdie Sons, MD   4 years ago Annual physical exam   [facility] Birdie Sons, MD   5 years ago Prediabetes   [facility] Birdie Sons, MD             Passed - Patient is not pregnant      Passed - Last BP in normal range    BP Readings from Last 1 Encounters:  06/29/20 124/76         . atorvastatin (LIPITOR) 20 MG tablet [Pharmacy Med Name: Atorvastatin Calcium 20 MG Oral Tablet] 90 tablet 0    Sig: TAKE 1 TABLET BY MOUTH  DAILY     Cardiovascular:  Antilipid - Statins Failed - 03/24/2021 12:29 AM      Failed - LDL in normal range and within 360 days    LDL Chol Calc (NIH)   Date Value Ref Range Status  06/30/2020 104 (H) 0 - 99 mg/dL Final         Passed - Total Cholesterol in normal range and within 360 days    Cholesterol, Total  Date Value Ref Range Status  06/30/2020 174 100 - 199 mg/dL Final         Passed - HDL in normal range and within 360 days    HDL  Date Value Ref Range Status  06/30/2020 51 >39 mg/dL Final         Passed - Triglycerides in normal range and within 360 days    Triglycerides  Date Value Ref Range Status  06/30/2020 102 0 - 149 mg/dL Final         Passed - Patient is not pregnant      Passed - Valid encounter within last 12 months    Recent Outpatient Visits          8 months ago Annual physical  exam   [facility] Birdie Sons, MD   1 year ago Annual physical exam   [facility] Birdie Sons, MD   3 years ago Chronic atrial fibrillation [facility])   [facility] Birdie Sons, MD   4 years ago Annual physical exam   [facility] Birdie Sons, MD   5 years ago [place], Donald E, MD

## 2021-05-06 ENCOUNTER — Other Ambulatory Visit: Payer: Self-pay | Admitting: Family Medicine

## 2021-05-08 NOTE — Telephone Encounter (Signed)
Pt overdue for office visit. Last RF 03/24/21 #30 MyChart message sent to pt to make appt. Requested Prescriptions  Pending Prescriptions Disp Refills   valsartan-hydrochlorothiazide (DIOVAN-HCT) 160-12.5 MG tablet [Pharmacy Med Name: Valsartan-hydroCHLOROthiazide 160-12.5 MG Oral Tablet] 30 tablet 11    Sig: TAKE 1 TABLET BY MOUTH  DAILY     Cardiovascular: ARB + Diuretic Combos Failed - 05/06/2021 10:44 PM      Failed - K in normal range and within 180 days    Potassium  Date Value Ref Range Status  06/30/2020 4.6 3.5 - 5.2 mmol/L Final          Failed - Na in normal range and within 180 days    Sodium  Date Value Ref Range Status  06/30/2020 141 134 - 144 mmol/L Final          Failed - Cr in normal range and within 180 days    Creatinine, Ser  Date Value Ref Range Status  06/30/2020 1.08 0.76 - 1.27 mg/dL Final          Failed - Ca in normal range and within 180 days    Calcium  Date Value Ref Range Status  06/30/2020 9.5 8.6 - 10.2 mg/dL Final          Failed - Valid encounter within last 6 months    Recent Outpatient Visits           10 months ago Annual physical exam   [facility] Birdie Sons, MD   2 years ago Annual physical exam   [facility] Birdie Sons, MD   3 years ago Chronic atrial fibrillation [facility])   [facility] Birdie Sons, MD   4 years ago Annual physical exam   [facility] Birdie Sons, MD   6 years ago Prediabetes   [facility] Birdie Sons, MD              Passed - Patient is not pregnant      Passed - Last BP in normal range    BP Readings from Last 1 Encounters:  06/29/20 124/76

## 2021-05-10 DIAGNOSIS — Z1211 Encounter for screening for malignant neoplasm of colon: Secondary | ICD-10-CM | POA: Diagnosis not present

## 2021-05-10 DIAGNOSIS — Z7901 Long term (current) use of anticoagulants: Secondary | ICD-10-CM | POA: Diagnosis not present

## 2021-06-16 ENCOUNTER — Other Ambulatory Visit: Payer: Self-pay | Admitting: Family Medicine

## 2021-06-17 NOTE — Telephone Encounter (Signed)
Requested Prescriptions  Pending Prescriptions Disp Refills   atorvastatin (LIPITOR) 20 MG tablet [Pharmacy Med Name: Atorvastatin Calcium 20 MG Oral Tablet] 90 tablet 0    Sig: TAKE 1 TABLET BY MOUTH ONCE DAILY     Cardiovascular:  Antilipid - Statins Failed - 06/16/2021 10:18 PM      Failed - LDL in normal range and within 360 days    LDL Chol Calc (NIH)  Date Value Ref Range Status  06/30/2020 104 (H) 0 - 99 mg/dL Final         Passed - Total Cholesterol in normal range and within 360 days    Cholesterol, Total  Date Value Ref Range Status  06/30/2020 174 100 - 199 mg/dL Final         Passed - HDL in normal range and within 360 days    HDL  Date Value Ref Range Status  06/30/2020 51 >39 mg/dL Final         Passed - Triglycerides in normal range and within 360 days    Triglycerides  Date Value Ref Range Status  06/30/2020 102 0 - 149 mg/dL Final         Passed - Patient is not pregnant      Passed - Valid encounter within last 12 months    Recent Outpatient Visits          11 months ago Annual physical exam   [facility] Birdie Sons, MD   2 years ago Annual physical exam   [facility] Birdie Sons, MD   3 years ago Chronic atrial fibrillation [facility])   [facility] Birdie Sons, MD   4 years ago Annual physical exam   [facility] Birdie Sons, MD   6 years ago [place], Kirstie Peri, MD

## 2021-07-05 DIAGNOSIS — H938X2 Other specified disorders of left ear: Secondary | ICD-10-CM | POA: Diagnosis not present

## 2021-07-05 DIAGNOSIS — H6122 Impacted cerumen, left ear: Secondary | ICD-10-CM | POA: Diagnosis not present

## 2021-07-08 DIAGNOSIS — H905 Unspecified sensorineural hearing loss: Secondary | ICD-10-CM | POA: Diagnosis not present

## 2021-07-18 ENCOUNTER — Encounter: Payer: Self-pay | Admitting: *Deleted

## 2021-07-19 ENCOUNTER — Encounter
Admission: RE | Disposition: A | Discharge status: Home / Self Care | Payer: Self-pay | Source: Home / Self Care | Attending: Gastroenterology

## 2021-07-19 ENCOUNTER — Ambulatory Visit: Payer: Medicare Other | Admitting: Certified Registered Nurse Anesthetist

## 2021-07-19 ENCOUNTER — Ambulatory Visit
Admission: RE | Admit: 2021-07-19 | Discharge: 2021-07-19 | Disposition: A | Discharge status: Home / Self Care | Payer: Medicare Other | Attending: Gastroenterology | Admitting: Gastroenterology

## 2021-07-19 ENCOUNTER — Encounter: Payer: Self-pay | Admitting: *Deleted

## 2021-07-19 DIAGNOSIS — Z87891 Personal history of nicotine dependence: Secondary | ICD-10-CM | POA: Insufficient documentation

## 2021-07-19 DIAGNOSIS — K5730 Diverticulosis of large intestine without perforation or abscess without bleeding: Secondary | ICD-10-CM | POA: Diagnosis not present

## 2021-07-19 DIAGNOSIS — K635 Polyp of colon: Secondary | ICD-10-CM | POA: Diagnosis not present

## 2021-07-19 DIAGNOSIS — D125 Benign neoplasm of sigmoid colon: Secondary | ICD-10-CM | POA: Insufficient documentation

## 2021-07-19 DIAGNOSIS — I10 Essential (primary) hypertension: Secondary | ICD-10-CM | POA: Diagnosis not present

## 2021-07-19 DIAGNOSIS — K640 First degree hemorrhoids: Secondary | ICD-10-CM | POA: Insufficient documentation

## 2021-07-19 DIAGNOSIS — I4891 Unspecified atrial fibrillation: Secondary | ICD-10-CM | POA: Insufficient documentation

## 2021-07-19 DIAGNOSIS — Z1211 Encounter for screening for malignant neoplasm of colon: Secondary | ICD-10-CM | POA: Diagnosis not present

## 2021-07-19 HISTORY — PX: COLONOSCOPY: SHX5424

## 2021-07-19 SURGERY — COLONOSCOPY
Anesthesia: General

## 2021-07-19 MED ORDER — PHENYLEPHRINE 40 MCG/ML (10ML) SYRINGE FOR IV PUSH (FOR BLOOD PRESSURE SUPPORT)
PREFILLED_SYRINGE | INTRAVENOUS | Status: DC | PRN
  Administered 2021-07-19 (×2): 80 ug via INTRAVENOUS
  Administered 2021-07-19: 160 ug via INTRAVENOUS

## 2021-07-19 MED ORDER — PROPOFOL 10 MG/ML IV BOLUS
INTRAVENOUS | Status: DC | PRN
  Administered 2021-07-19: 60 mg via INTRAVENOUS

## 2021-07-19 MED ORDER — PROPOFOL 500 MG/50ML IV EMUL
INTRAVENOUS | Status: DC | PRN
  Administered 2021-07-19: 140 ug/kg/min via INTRAVENOUS

## 2021-07-19 MED ORDER — LIDOCAINE HCL (CARDIAC) PF 100 MG/5ML IV SOSY
PREFILLED_SYRINGE | INTRAVENOUS | Status: DC | PRN
  Administered 2021-07-19: 50 mg via INTRAVENOUS

## 2021-07-19 MED ORDER — SODIUM CHLORIDE 0.9 % IV SOLN: INTRAVENOUS | Status: DC

## 2021-07-19 NOTE — Interval H&P Note (Signed)
History and Physical Interval Note:  07/19/2021 10:00 AM  Mark Pierce  has presented today for surgery, with the diagnosis of CCA SCREEN.  The various methods of treatment have been discussed with the patient and family. After consideration of risks, benefits and other options for treatment, the patient has consented to  Procedure(s): COLONOSCOPY (N/A) as a surgical intervention.  The patient's history has been reviewed, patient examined, no change in status, stable for surgery.  I have reviewed the patient's chart and labs.  Questions were answered to the patient's satisfaction.     Lesly Rubenstein  Ok to proceed with colonoscopy

## 2021-07-19 NOTE — Transfer of Care (Signed)
Immediate Anesthesia Transfer of Care Note  Patient: Mark Pierce  Procedure(s) Performed: COLONOSCOPY  Patient Location: Endoscopy Unit  Anesthesia Type:General  Level of Consciousness: drowsy  Airway & Oxygen Therapy: Patient Spontanous Breathing  Post-op Assessment: Report given to RN and Post -op Vital signs reviewed and stable  Post vital signs: Reviewed and stable  Last Vitals:  Vitals Value Taken Time  BP 94/58 07/19/21 1031  Temp 36.3 C 07/19/21 1031  Pulse 94 07/19/21 1032  Resp 13 07/19/21 1032  SpO2 97 % 07/19/21 1032  Vitals shown include unvalidated device data.  Last Pain:  Vitals:   07/19/21 1031  TempSrc: Temporal  PainSc: Asleep         Complications: No notable events documented.

## 2021-07-19 NOTE — Anesthesia Procedure Notes (Signed)
Date/Time: 07/19/2021 10:08 AM Performed by: Lily Peer, Harvin Konicek, CRNA Pre-anesthesia Checklist: Patient identified, Emergency Drugs available, Suction available, Patient being monitored and Timeout performed Patient Re-evaluated:Patient Re-evaluated prior to induction Oxygen Delivery Method: Nasal cannula Induction Type: IV induction

## 2021-07-19 NOTE — H&P (Signed)
Outpatient short stay form Pre-procedure 07/19/2021  Lesly Rubenstein, MD  Primary Physician: Birdie Sons, MD  Reason for visit:  Screening  History of present illness:    71 y/o gentleman with history of hypertension and a. Fib on NOAC with last dose being 3 days ago here for screening colonoscopy. Last colonoscopy was in 2010 and was normal. No family history of GI malignancies. History of appendectomy and hernia repair.    Current Facility-Administered Medications:    0.9 %  sodium chloride infusion, , Intravenous, Continuous, Riku Buttery, Hilton Cork, MD  Medications Prior to Admission  Medication Sig Dispense Refill Last Dose   atorvastatin (LIPITOR) 20 MG tablet TAKE 1 TABLET BY MOUTH ONCE DAILY 90 tablet 0 07/18/2021   diltiazem (CARDIZEM CD) 120 MG 24 hr capsule Take 120 mg by mouth daily.   07/18/2021   valsartan-hydrochlorothiazide (DIOVAN-HCT) 160-12.5 MG tablet TAKE 1 TABLET BY MOUTH  DAILY 90 tablet 1 07/18/2021   ELIQUIS 5 MG TABS tablet Take 5 mg by mouth 2 (two) times daily.   07/15/2021   loratadine (CLARITIN) 10 MG tablet Take 10 mg by mouth daily. (Patient not taking: Reported on 07/19/2021)   Not Taking   Multiple Vitamins-Minerals (MULTIVITAMIN ADULT PO) Take 1 tablet by mouth daily.   07/17/2021   sildenafil (REVATIO) 20 MG tablet 3-5 tablets as needed prior to intercourse, not to exceed 5 tablets in a day 30 tablet 6    sildenafil (VIAGRA) 100 MG tablet Take 1 tablet (100 mg total) by mouth daily as needed for erectile dysfunction. 30 tablet 4      Allergies  Allergen Reactions   Lisinopril Cough    dry cough dry cough     Past Medical History:  Diagnosis Date   A-fib ([place])    History of chicken pox    Hyperlipidemia    Hypertension     Review of systems:  Otherwise negative.    Physical Exam  Gen: Alert, oriented. Appears stated age.  HEENT: PERRLA. Lungs: No respiratory distress CV: RRR Abd: soft, benign, no masses Ext: No edema    Planned  procedures: Proceed with colonoscopy. The patient understands the nature of the planned procedure, indications, risks, alternatives and potential complications including but not limited to bleeding, infection, perforation, damage to internal organs and possible oversedation/side effects from anesthesia. The patient agrees and gives consent to proceed.  Please refer to procedure notes for findings, recommendations and patient disposition/instructions.     Lesly Rubenstein, MD [facility]

## 2021-07-19 NOTE — Anesthesia Preprocedure Evaluation (Signed)
Anesthesia Evaluation  Patient identified by MRN, date of birth, ID band Patient awake    Reviewed: Allergy & Precautions, NPO status , Patient's Chart, lab work & pertinent test results  History of Anesthesia Complications Negative for: history of anesthetic complications  Airway Mallampati: III  TM Distance: <3 FB Neck ROM: full    Dental  (+) Chipped   Pulmonary neg pulmonary ROS, neg shortness of breath, former smoker,    Pulmonary exam normal        Cardiovascular Exercise Tolerance: Good hypertension, (-) anginaNormal cardiovascular exam     Neuro/Psych negative neurological ROS  negative psych ROS   GI/Hepatic negative GI ROS, Neg liver ROS, neg GERD  ,  Endo/Other  negative endocrine ROS  Renal/GU negative Renal ROS  negative genitourinary   Musculoskeletal   Abdominal   Peds  Hematology negative hematology ROS (+)   Anesthesia Other Findings Past Medical History: No date: A-fib ([place]) No date: History of chicken pox No date: Hyperlipidemia No date: Hypertension  Past Surgical History: 06/13/1979: APPENDECTOMY No date: HERNIA REPAIR 06/12/2006: SPINAL FUSION     Comment:  patient stated that he had a cervical spine infusion  No date: WISDOM TOOTH EXTRACTION  BMI    Body Mass Index: 27.32 kg/m      Reproductive/Obstetrics negative OB ROS                             Anesthesia Physical Anesthesia Plan  ASA: 2  Anesthesia Plan: General   Post-op Pain Management:    Induction: Intravenous  PONV Risk Score and Plan: Propofol infusion and TIVA  Airway Management Planned: Natural Airway and Nasal Cannula  Additional Equipment:   Intra-op Plan:   Post-operative Plan:   Informed Consent: I have reviewed the patients History and Physical, chart, labs and discussed the procedure including the risks, benefits and alternatives for the proposed anesthesia with the  patient or authorized representative who has indicated his/her understanding and acceptance.     Dental Advisory Given  Plan Discussed with: Anesthesiologist, CRNA and Surgeon  Anesthesia Plan Comments: (Patient consented for risks of anesthesia including but not limited to:  - adverse reactions to medications - risk of airway placement if required - damage to eyes, teeth, lips or other oral mucosa - nerve damage due to positioning  - sore throat or hoarseness - Damage to heart, brain, nerves, lungs, other parts of body or loss of life  Patient voiced understanding.)        Anesthesia Quick Evaluation

## 2021-07-19 NOTE — Anesthesia Postprocedure Evaluation (Signed)
Anesthesia Post Note  Patient: Mark Pierce  Procedure(s) Performed: COLONOSCOPY  Patient location during evaluation: Endoscopy Anesthesia Type: General Level of consciousness: awake and alert Pain management: pain level controlled Vital Signs Assessment: post-procedure vital signs reviewed and stable Respiratory status: spontaneous breathing, nonlabored ventilation, respiratory function stable and patient connected to nasal cannula oxygen Cardiovascular status: blood pressure returned to baseline and stable Postop Assessment: no apparent nausea or vomiting Anesthetic complications: no   No notable events documented.   Last Vitals:  Vitals:   07/19/21 1051 07/19/21 1101  BP: 106/69 118/76  Pulse:    Resp:    Temp:    SpO2:      Last Pain:  Vitals:   07/19/21 1101  TempSrc:   PainSc: 0-No pain                 Precious Haws Jasia Hiltunen

## 2021-07-19 NOTE — Op Note (Signed)
[facility] Patient Name: Mark Pierce Procedure Date: 07/19/2021 9:47 AM MRN: [id number] Account #: [id number] Date of Birth: September 03, 1950 Admit Type: Outpatient Age: 71 Room: [facility] ENDO ROOM 1 Gender: Male Note Status: Finalized Instrument Name: Jasper Riling [id number] Procedure:             Colonoscopy Indications:           Screening for colorectal malignant neoplasm Providers:             Andrey Farmer MD, MD Referring MD:          Kirstie Peri. Caryn Section, MD (Referring MD) Medicines:             Monitored Anesthesia Care Complications:         No immediate complications. Estimated blood loss:                         Minimal. Procedure:             Pre-Anesthesia Assessment:                        - Prior to the procedure, a History and Physical was                         performed, and patient medications and allergies were                         reviewed. The patient is competent. The risks and                         benefits of the procedure and the sedation options and                         risks were discussed with the patient. All questions                         were answered and informed consent was obtained.                         Patient identification and proposed procedure were                         verified by the physician, the nurse, the                         anesthesiologist, the anesthetist and the technician                         in the endoscopy suite. Mental Status Examination:                         alert and oriented. Airway Examination: normal                         oropharyngeal airway and neck mobility. Respiratory                         Examination: clear to auscultation. CV Examination:  normal. Prophylactic Antibiotics: The patient does not                         require prophylactic antibiotics. Prior                         Anticoagulants: The patient has taken Eliquis                          (apixaban), last dose was 3 days prior to procedure.                         ASA Grade Assessment: II - A patient with mild                         systemic disease. After reviewing the risks and                         benefits, the patient was deemed in satisfactory                         condition to undergo the procedure. The anesthesia                         plan was to use monitored anesthesia care (MAC).                         Immediately prior to administration of medications,                         the patient was re-assessed for adequacy to receive                         sedatives. The heart rate, respiratory rate, oxygen                         saturations, blood pressure, adequacy of pulmonary                         ventilation, and response to care were monitored                         throughout the procedure. The physical status of the                         patient was re-assessed after the procedure.                        After obtaining informed consent, the colonoscope was                         passed under direct vision. Throughout the procedure,                         the patient's blood pressure, pulse, and oxygen                         saturations were monitored continuously. The  Colonoscope was introduced through the anus and                         advanced to the the terminal ileum. The colonoscopy                         was performed without difficulty. The patient                         tolerated the procedure well. The quality of the bowel                         preparation was excellent. Findings:      The perianal and digital rectal examinations were normal.      The terminal ileum appeared normal.      A 1 mm polyp was found in the sigmoid colon. The polyp was sessile. The       polyp was removed with a jumbo cold forceps. Resection and retrieval       were complete. Estimated blood loss was minimal.       Many small and large-mouthed diverticula were found in the sigmoid colon       and descending colon. No diverticulum seen in right colon, only left       colon.      Internal hemorrhoids were found during retroflexion. The hemorrhoids       were Grade I (internal hemorrhoids that do not prolapse).      The exam was otherwise without abnormality on direct and retroflexion       views. Impression:            - The examined portion of the ileum was normal.                        - One 1 mm polyp in the sigmoid colon, removed with a                         jumbo cold forceps. Resected and retrieved.                        - Diverticulosis in the sigmoid colon and in the                         descending colon.                        - Internal hemorrhoids.                        - The examination was otherwise normal on direct and                         retroflexion views. Recommendation:        - Discharge patient to home.                        - Resume previous diet.                        - Continue present medications.                        -  Await pathology results.                        - Repeat colonoscopy is not recommended due to current                         age (24 years or older) for surveillance.                        - Return to referring physician as previously                         scheduled. Procedure Code(s):     --- Professional ---                        [phone], Colonoscopy, flexible; with biopsy, single or                         multiple Diagnosis Code(s):     --- Professional ---                        Z12.11, Encounter for screening for malignant neoplasm                         of colon                        K64.0, First degree hemorrhoids                        K63.5, Polyp of colon                        K57.30, Diverticulosis of large intestine without                         perforation or abscess without bleeding CPT copyright 2019 American Medical  Association. All rights reserved. The codes documented in this report are preliminary and upon coder review may  be revised to meet current compliance requirements. Andrey Farmer MD, MD 07/19/2021 10:33:47 AM Number of Addenda: 0 Note Initiated On: 07/19/2021 9:47 AM Scope Withdrawal Time: 0 hours 10 minutes 44 seconds  Total Procedure Duration: 0 hours 17 minutes 41 seconds  Estimated Blood Loss:  Estimated blood loss was minimal.      [facility]

## 2021-07-20 ENCOUNTER — Encounter: Payer: Self-pay | Admitting: Gastroenterology

## 2021-07-20 LAB — SURGICAL PATHOLOGY

## 2021-08-15 ENCOUNTER — Ambulatory Visit (INDEPENDENT_AMBULATORY_CARE_PROVIDER_SITE_OTHER): Payer: Medicare Other

## 2021-08-15 ENCOUNTER — Encounter: Payer: Self-pay | Admitting: Family Medicine

## 2021-08-15 ENCOUNTER — Other Ambulatory Visit: Payer: Self-pay | Admitting: Family Medicine

## 2021-08-15 VITALS — Ht 69.0 in | Wt 188.0 lb

## 2021-08-15 DIAGNOSIS — I10 Essential (primary) hypertension: Secondary | ICD-10-CM

## 2021-08-15 DIAGNOSIS — Z0000 Encounter for general adult medical examination without abnormal findings: Secondary | ICD-10-CM

## 2021-08-15 NOTE — Progress Notes (Signed)
Virtual Visit via Telephone Note  I connected with  Mark Pierce on 08/15/21 at  9:40 AM EST by telephone and verified that I am speaking with the correct person using two identifiers.  Location: Patient: home Provider: [facility] Persons participating in the virtual visit: [place]   I discussed the limitations, risks, security and privacy concerns of performing an evaluation and management service by telephone and the availability of in person appointments. The patient expressed understanding and agreed to proceed.  Interactive audio and video telecommunications were attempted between this nurse and patient, however failed, due to patient having technical difficulties OR patient did not have access to video capability.  We continued and completed visit with audio only.  Some vital signs may be absent or patient reported.   Mark David, LPN  Subjective:   Mark Pierce is a 71 y.o. male who presents for Medicare Annual/Subsequent preventive examination.  Review of Systems           Objective:    Today's Vitals   08/15/21 0937  Weight: 188 lb (85.3 kg)  Height: '5\' 9"'$  (1.753 m)   Body mass index is 27.76 kg/m.  Advanced Directives 07/19/2021 01/08/2020 01/07/2019 09/06/2017 08/24/2016  Does Patient Have a Medical Advance Directive? No Yes Yes Yes Yes  Type of Advance Directive - [place];Living will [place];Living will [place];Living will [place];Living will  Copy of [place] in Chart? - No - copy requested No - copy requested No - copy requested No - copy requested    Current Medications (verified) Outpatient Encounter Medications as of 08/15/2021  Medication Sig   apixaban (ELIQUIS) 5 MG TABS tablet Take 1 tablet by mouth 2 (two) times daily.   atorvastatin (LIPITOR) 20 MG tablet Take 1 tablet by mouth at bedtime.   diltiazem (TIAZAC) 120 MG 24 hr  capsule Take 1 capsule by mouth daily.   atorvastatin (LIPITOR) 20 MG tablet TAKE 1 TABLET BY MOUTH ONCE DAILY   diltiazem (CARDIZEM CD) 120 MG 24 hr capsule Take 120 mg by mouth daily.   ELIQUIS 5 MG TABS tablet Take 5 mg by mouth 2 (two) times daily.   FLUZONE HIGH-DOSE QUADRIVALENT 0.7 ML SUSY    loratadine (CLARITIN) 10 MG tablet Take 10 mg by mouth daily. (Patient not taking: Reported on 07/19/2021)   Multiple Vitamins-Minerals (MULTIVITAMIN ADULT PO) Take 1 tablet by mouth daily.   polyethylene glycol-electrolytes (NULYTELY) 420 g solution Take by mouth.   sildenafil (REVATIO) 20 MG tablet 3-5 tablets as needed prior to intercourse, not to exceed 5 tablets in a day   sildenafil (VIAGRA) 100 MG tablet Take 1 tablet (100 mg total) by mouth daily as needed for erectile dysfunction.   valsartan-hydrochlorothiazide (DIOVAN-HCT) 160-12.5 MG tablet TAKE 1 TABLET BY MOUTH  DAILY   No facility-administered encounter medications on file as of 08/15/2021.    Allergies (verified) Lisinopril   History: Past Medical History:  Diagnosis Date   A-fib ([place])    History of chicken pox    Hyperlipidemia    Hypertension    Past Surgical History:  Procedure Laterality Date   APPENDECTOMY  06/13/1979   COLONOSCOPY N/A 07/19/2021   Procedure: COLONOSCOPY;  Surgeon: Lesly Rubenstein, MD;  Location: [facility] ENDOSCOPY;  Service: Endoscopy;  Laterality: N/A;   HERNIA REPAIR     SPINAL FUSION  06/12/2006   patient stated that he had a cervical spine  infusion    WISDOM TOOTH EXTRACTION     Family History  Problem Relation Age of Onset   CAD Father    Colitis Father    Heart attack Father    Hyperlipidemia Brother    Mitral valve prolapse Brother    Heart attack Paternal Grandmother    Social History   Socioeconomic History   Marital status: Married    Spouse name: Not on file   Number of children: 2   Years of education: Not on file   Highest education level: Bachelor's degree (e.g., BA, AB,  BS)  Occupational History   Occupation: Retired    Comment: 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  Tobacco Use   Smoking status: Former    Packs/day: 1.00    Types: Cigarettes    Quit date: 06/12/1998    Years since quitting: 23.1   Smokeless tobacco: Never  Vaping Use   Vaping Use: Never used  Substance and Sexual Activity   Alcohol use: Yes    Alcohol/week: 21.0 standard drinks    Types: 7 Glasses of wine, 4 Cans of beer, 10 Shots of liquor per week    Comment: moderate use   Drug use: Yes    Types: Marijuana   Sexual activity: Yes  Other Topics Concern   Not on file  Social History Narrative   Not on file   Social Determinants of Health   Financial Resource Strain: Not on file  Food Insecurity: Not on file  Transportation Needs: Not on file  Physical Activity: Not on file  Stress: Not on file  Social Connections: Not on file    Tobacco Counseling Counseling given: Not Answered   Clinical Intake:  Pre-visit preparation completed: Yes  Pain : No/denies pain     Nutritional Risks: None Diabetes: No  How often do you need to have someone help you when you read instructions, pamphlets, or other written materials from your doctor or pharmacy?: 1 - Never  Diabetic?no  Interpreter Needed?: No  Information entered by :: Kirke Shaggy, LPN   Activities of Daily Living In your present state of health, do you have any difficulty performing the following activities: 08/14/2021  Hearing? N  Vision? N  Difficulty concentrating or making decisions? N  Walking or climbing stairs? N  Dressing or bathing? N  Doing errands, shopping? N  Preparing Food and eating ? N  Using the Toilet? N  In the past six months, have you accidently leaked urine? N  Do you have problems with loss of bowel control? N  Managing your Medications? N  Managing your Finances? N  Housekeeping or managing your Housekeeping? N  Some recent data might be hidden    Patient Care  Team: Birdie Sons, MD as PCP - General (Family Medicine) Ubaldo Glassing Javier Docker, MD as Consulting Physician (Cardiology) Ree Edman, MD (Dermatology) [place], [place] any recent Medical Services you may have received from other than [facility] providers in the past year (date may be approximate).     Assessment:   This is a routine wellness examination for Mark Pierce.  Hearing/Vision screen No results found.  Dietary issues and exercise activities discussed:     Goals Addressed   None    Depression Screen PHQ 2/9 Scores 06/29/2020 01/08/2020 01/07/2019 09/06/2017 09/06/2017 08/24/2016 04/28/2015  PHQ - 2 Score 0 0 0 0 0 0 0  PHQ- 9 Score 0 - - 0 - 0 0  Fall Risk Fall Risk  08/14/2021 06/29/2020 01/08/2020 01/07/2019 09/06/2017  Falls in the past year? 0 0 0 0 No  Number falls in past yr: 0 0 0 - -  Injury with Fall? 0 0 0 - -  Follow up - Falls evaluation completed - - -    FALL RISK PREVENTION PERTAINING TO THE HOME:  Any stairs in or around the home? Yes  If so, are there any without handrails? No  Home free of loose throw rugs in walkways, pet beds, electrical cords, etc? Yes  Adequate lighting in your home to reduce risk of falls? Yes   ASSISTIVE DEVICES UTILIZED TO PREVENT FALLS:  Life alert? No  Use of a cane, walker or w/c? No  Grab bars in the bathroom? No  Shower chair or bench in shower? No  Elevated toilet seat or a handicapped toilet? No   Cognitive Function:Normal cognitive status assessed by direct observation by this Nurse Health Advisor. No abnormalities found.       6CIT Screen 01/07/2019 08/24/2016  What Year? 0 points 0 points  What month? 0 points 0 points  What time? 0 points 0 points  Count back from 20 0 points 0 points  Months in reverse 0 points 0 points  Repeat phrase 0 points 0 points  Total Score 0 0    Immunizations Immunization History  Administered Date(s) Administered   H1N1 04/27/2008   Influenza, High Dose  Seasonal PF 03/11/2017, 03/21/2019, 04/26/2020   Influenza-Unspecified 04/13/2015, 03/26/2018   Moderna Sars-Covid-2 Vaccination 07/18/2019, 08/15/2019, 04/06/2020   Pneumococcal Conjugate-13 10/06/2013   Pneumococcal Polysaccharide-23 04/28/2015   Td 06/02/2003   Zoster, Live 03/08/2013    TDAP status: Due, Education has been provided regarding the importance of this vaccine. Advised may receive this vaccine at local pharmacy or Health Dept. Aware to provide a copy of the vaccination record if obtained from local pharmacy or Health Dept. Verbalized acceptance and understanding.  Flu Vaccine status: Up to date  Pneumococcal vaccine status: Up to date  Covid-19 vaccine status: Completed vaccines  Qualifies for Shingles Vaccine? Yes   Zostavax completed Yes   Shingrix Completed?: Yes  Screening Tests Health Maintenance  Topic Date Due   Zoster Vaccines- Shingrix (1 of 2) Never done   TETANUS/TDAP  06/01/2013   COVID-19 Vaccine (4 - Booster for Moderna series) 06/01/2020   INFLUENZA VACCINE  01/10/2021   COLONOSCOPY (Pts 45-13yr Insurance coverage will need to be confirmed)  07/20/2031   Pneumonia Vaccine 71 Years old  Completed   Hepatitis C Screening  Completed   HPV VACCINES  Aged Out    Health Maintenance  Health Maintenance Due  Topic Date Due   Zoster Vaccines- Shingrix (1 of 2) Never done   TETANUS/TDAP  06/01/2013   COVID-19 Vaccine (4 - Booster for Moderna series) 06/01/2020   INFLUENZA VACCINE  01/10/2021    Colorectal cancer screening: Type of screening: Colonoscopy. Completed 07/19/21. Repeat every DONE years  Lung Cancer Screening: (Low Dose CT Chest recommended if Age 763-80years, 30 pack-year currently smoking OR have quit w/in 15years.) does not qualify.    Additional Screening:  Hepatitis C Screening: does qualify; Completed 08/24/16  Vision Screening: Recommended annual ophthalmology exams for early detection of glaucoma and other disorders of the  eye. Is the patient up to date with their annual eye exam?  Yes  Who is the provider or what is the name of the office in which the patient attends annual eye exams?  [facility] If pt is not established with a provider, would they like to be referred to a provider to establish care? No .   Dental Screening: Recommended annual dental exams for proper oral hygiene  Community Resource Referral / Chronic Care Management: CRR required this visit?  No   CCM required this visit?  No      Plan:     I have personally reviewed and noted the following in the patients chart:   Medical and social history Use of alcohol, tobacco or illicit drugs  Current medications and supplements including opioid prescriptions. Patient is not currently taking opioid prescriptions. Functional ability and status Nutritional status Physical activity Advanced directives List of other physicians Hospitalizations, surgeries, and ER visits in previous 12 months Vitals Screenings to include cognitive, depression, and falls Referrals and appointments  In addition, I have reviewed and discussed with patient certain preventive protocols, quality metrics, and best practice recommendations. A written personalized care plan for preventive services as well as general preventive health recommendations were provided to patient.     Mark David, LPN   11/16/6193   Nurse Notes: none

## 2021-08-15 NOTE — Telephone Encounter (Signed)
Not sure which Sildenafil patient is needing? ?

## 2021-08-15 NOTE — Patient Instructions (Addendum)
[name] , Thank you for taking time to come for your Medicare Wellness Visit. I appreciate your ongoing commitment to your health goals. Please review the following plan we discussed and let me know if I can assist you in the future.   Screening recommendations/referrals: Colonoscopy: 07/19/21 Recommended yearly ophthalmology/optometry visit for glaucoma screening and checkup Recommended yearly dental visit for hygiene and checkup  Vaccinations: Influenza vaccine: 03/26/21 Pneumococcal vaccine: 04/28/15 Tdap vaccine: 06/02/03 Shingles vaccine: zostavax 03/08/13- pt states had Shingrix vaccines   Covid-19: 07/18/19, 08/15/19, 04/06/20  Advanced directives: no  Conditions/risks identified: none  Next appointment: Follow up in one year for your annual wellness visit. - 08/17/22 @ 9:40 by phone  Preventive Care 65 Years and Older, Male Preventive care refers to lifestyle choices and visits with your health care provider that can promote health and wellness. What does preventive care include? A yearly physical exam. This is also called an annual well check. Dental exams once or twice a year. Routine eye exams. Ask your health care provider how often you should have your eyes checked. Personal lifestyle choices, including: Daily care of your teeth and gums. Regular physical activity. Eating a healthy diet. Avoiding tobacco and drug use. Limiting alcohol use. Practicing safe sex. Taking low doses of aspirin every day. Taking vitamin and mineral supplements as recommended by your health care provider. What happens during an annual well check? The services and screenings done by your health care provider during your annual well check will depend on your age, overall health, lifestyle risk factors, and family history of disease. Counseling  Your health care provider may ask you questions about your: Alcohol use. Tobacco use. Drug use. Emotional well-being. Home and relationship  well-being. Sexual activity. Eating habits. History of falls. Memory and ability to understand (cognition). Work and work Statistician. Screening  You may have the following tests or measurements: Height, weight, and BMI. Blood pressure. Lipid and cholesterol levels. These may be checked every 5 years, or more frequently if you are over 73 years old. Skin check. Lung cancer screening. You may have this screening every year starting at age 73 if you have a 30-pack-year history of smoking and currently smoke or have quit within the past 15 years. Fecal occult blood test (FOBT) of the stool. You may have this test every year starting at age 73. Flexible sigmoidoscopy or colonoscopy. You may have a sigmoidoscopy every 5 years or a colonoscopy every 10 years starting at age 62. Prostate cancer screening. Recommendations will vary depending on your family history and other risks. Hepatitis C blood test. Hepatitis B blood test. Sexually transmitted disease (STD) testing. Diabetes screening. This is done by checking your blood sugar (glucose) after you have not eaten for a while (fasting). You may have this done every 1-3 years. Abdominal aortic aneurysm (AAA) screening. You may need this if you are a current or former smoker. Osteoporosis. You may be screened starting at age 32 if you are at high risk. Talk with your health care provider about your test results, treatment options, and if necessary, the need for more tests. Vaccines  Your health care provider may recommend certain vaccines, such as: Influenza vaccine. This is recommended every year. Tetanus, diphtheria, and acellular pertussis (Tdap, Td) vaccine. You may need a Td booster every 10 years. Zoster vaccine. You may need this after age 12. Pneumococcal 13-valent conjugate (PCV13) vaccine. One dose is recommended after age 21. Pneumococcal polysaccharide (PPSV23) vaccine. One dose is recommended after age  38. Talk to your health care  provider about which screenings and vaccines you need and how often you need them. This information is not intended to replace advice given to you by your health care provider. Make sure you discuss any questions you have with your health care provider. Document Released: 06/25/2015 Document Revised: 02/16/2016 Document Reviewed: 03/30/2015 Elsevier Interactive Patient Education  2017 [place] Prevention in the Home Falls can cause injuries. They can happen to people of all ages. There are many things you can do to make your home safe and to help prevent falls. What can I do on the outside of my home? Regularly fix the edges of walkways and driveways and fix any cracks. Remove anything that might make you trip as you walk through a door, such as a raised step or threshold. Trim any bushes or trees on the path to your home. Use bright outdoor lighting. Clear any walking paths of anything that might make someone trip, such as rocks or tools. Regularly check to see if handrails are loose or broken. Make sure that both sides of any steps have handrails. Any raised decks and porches should have guardrails on the edges. Have any leaves, snow, or ice cleared regularly. Use sand or salt on walking paths during winter. Clean up any spills in your garage right away. This includes oil or grease spills. What can I do in the bathroom? Use night lights. Install grab bars by the toilet and in the tub and shower. Do not use towel bars as grab bars. Use non-skid mats or decals in the tub or shower. If you need to sit down in the shower, use a plastic, non-slip stool. Keep the floor dry. Clean up any water that spills on the floor as soon as it happens. Remove soap buildup in the tub or shower regularly. Attach bath mats securely with double-sided non-slip rug tape. Do not have throw rugs and other things on the floor that can make you trip. What can I do in the bedroom? Use night lights. Make  sure that you have a light by your bed that is easy to reach. Do not use any sheets or blankets that are too big for your bed. They should not hang down onto the floor. Have a firm chair that has side arms. You can use this for support while you get dressed. Do not have throw rugs and other things on the floor that can make you trip. What can I do in the kitchen? Clean up any spills right away. Avoid walking on wet floors. Keep items that you use a lot in easy-to-reach places. If you need to reach something above you, use a strong step stool that has a grab bar. Keep electrical cords out of the way. Do not use floor polish or wax that makes floors slippery. If you must use wax, use non-skid floor wax. Do not have throw rugs and other things on the floor that can make you trip. What can I do with my stairs? Do not leave any items on the stairs. Make sure that there are handrails on both sides of the stairs and use them. Fix handrails that are broken or loose. Make sure that handrails are as long as the stairways. Check any carpeting to make sure that it is firmly attached to the stairs. Fix any carpet that is loose or worn. Avoid having throw rugs at the top or bottom of the stairs. If you do have  throw rugs, attach them to the floor with carpet tape. Make sure that you have a light switch at the top of the stairs and the bottom of the stairs. If you do not have them, ask someone to add them for you. What else can I do to help prevent falls? Wear shoes that: Do not have high heels. Have rubber bottoms. Are comfortable and fit you well. Are closed at the toe. Do not wear sandals. If you use a stepladder: Make sure that it is fully opened. Do not climb a closed stepladder. Make sure that both sides of the stepladder are locked into place. Ask someone to hold it for you, if possible. Clearly mark and make sure that you can see: Any grab bars or handrails. First and last steps. Where the  edge of each step is. Use tools that help you move around (mobility aids) if they are needed. These include: Canes. Walkers. Scooters. Crutches. Turn on the lights when you go into a dark area. Replace any light bulbs as soon as they burn out. Set up your furniture so you have a clear path. Avoid moving your furniture around. If any of your floors are uneven, fix them. If there are any pets around you, be aware of where they are. Review your medicines with your doctor. Some medicines can make you feel dizzy. This can increase your chance of falling. Ask your doctor what other things that you can do to help prevent falls. This information is not intended to replace advice given to you by your health care provider. Make sure you discuss any questions you have with your health care provider. Document Released: 03/25/2009 Document Revised: 11/04/2015 Document Reviewed: 07/03/2014 Elsevier Interactive Patient Education  2017 [employer].

## 2021-08-16 MED ORDER — SILDENAFIL CITRATE 20 MG PO TABS: ORAL_TABLET | ORAL | 6 refills | Status: DC

## 2021-08-16 MED ORDER — ATORVASTATIN CALCIUM 20 MG PO TABS: 20.0000 mg | ORAL_TABLET | Freq: Every day | ORAL | 1 refills | Status: DC

## 2021-08-17 ENCOUNTER — Encounter: Payer: Self-pay | Admitting: Family Medicine

## 2021-08-17 DIAGNOSIS — Z86010 Personal history of colonic polyps: Secondary | ICD-10-CM | POA: Insufficient documentation

## 2021-10-21 ENCOUNTER — Ambulatory Visit: Payer: Medicare Other | Admitting: Family Medicine

## 2021-10-24 NOTE — Progress Notes (Signed)
?  ? ? ?Established patient visit ? ? ?Patient: Mark Pierce   DOB: 15-Oct-1950   71 y.o. Male  MRN: [id number] ?Visit Date: 10/25/2021 ? ?Today's healthcare provider: Lelon Huh, MD  ? ?I,Kathleen J Wolford,acting as a Education administrator for Lelon Huh, MD.,have documented all relevant documentation on the behalf of Lelon Huh, MD,as directed by  Lelon Huh, MD while in the presence of Lelon Huh, MD.  ?Chief Complaint  ?Patient presents with  ? Hypertension  ? Hyperlipidemia  ? Prediabetes  ? ?Subjective  ?  ?HPI  ?Hypertension, follow-up ? ?BP Readings from Last 3 Encounters:  ?10/25/21 (!) 142/111  ?07/19/21 118/76  ?06/29/20 124/76  ? Wt Readings from Last 3 Encounters:  ?10/25/21 191 lb 9.6 oz (86.9 kg)  ?08/15/21 188 lb (85.3 kg)  ?07/19/21 185 lb (83.9 kg)  ?  ? ?He was last seen for hypertension 1  year  ago.  ?BP at that visit was 124/76. Management since that visit includes continuing same medication. ? ?He reports excellent compliance with treatment. ?He is not having side effects.  ?He is following a Regular diet. ?He is exercising. Has starting [employer] classes 2 days a week and feels very well, and back pain is much improved.  ?He does not smoke. ? ?Use of agents associated with hypertension: none.  ? ?Outside blood pressures are not checked. ?Symptoms: ?No chest pain No chest pressure  ?No palpitations No syncope  ?No dyspnea No orthopnea  ?No paroxysmal nocturnal dyspnea No lower extremity edema  ? ?---------------------------------------------------------------------------------------------------  ? ?Lipid/Cholesterol, Follow-up ? ?Last lipid panel Other pertinent labs  ?Lab Results  ?Component Value Date  ? CHOL 174 06/30/2020  ? HDL 51 06/30/2020  ? LDLCALC 104 (H) 06/30/2020  ? TRIG 102 06/30/2020  ? CHOLHDL 3.4 06/30/2020  ? Lab Results  ?Component Value Date  ? ALT 33 06/30/2020  ? AST 23 06/30/2020  ? PLT 259 06/30/2020  ? TSH 1.390 11/23/2017  ?  ? ?He was last seen for this 1   year  ago.  ?Management since that visit includes continuing same medication. ? ?He reports excellent compliance with treatment. ?He is not having side effects.  ? ?Symptoms: ?No chest pain No chest pressure/discomfort  ?No dyspnea No lower extremity edema  ?No numbness or tingling of extremity No orthopnea  ?No palpitations No paroxysmal nocturnal dyspnea  ?No speech difficulty No syncope  ? ?Current diet: well balanced ?Current exercise: walking ? ?The 10-year ASCVD risk score (Arnett DK, et al., 2019) is: 27.9% ? ?---------------------------------------------------------------------------------------------------  ? ?Prediabetes, Follow-up ? ?Lab Results  ?Component Value Date  ? HGBA1C 6.4 (H) 06/30/2020  ? HGBA1C 6.2 (H) 05/07/2019  ? HGBA1C 6.0 (H) 11/23/2017  ? GLUCOSE 135 (H) 06/30/2020  ? GLUCOSE 141 (H) 05/07/2019  ? GLUCOSE 132 (H) 11/23/2017  ? ? ?Last seen for for this 1  year  ago.  ?Management since that visit includes advising patient to strictly avoid sweets and white starchy foods, and be sure to exercise regularly.  ?Current symptoms include none and have been unchanged. ? ?Prior visit with dietician: no ?Current diet: well balanced ?Current exercise: walking ? ?-----------------------------------------------------------------------------------------  ?He continues to follow up with Dr. Ubaldo Glassing for asymptomatic atrial fibrillation. He is schedule for consultation with Dr. Marcello Moores regarding Watchman device. He is not having any unusual bleeding or bruising.  ? ?Medications: ?Outpatient Medications Prior to Visit  ?Medication Sig  ? apixaban (ELIQUIS) 5 MG TABS tablet Take 1 tablet by mouth  2 (two) times daily.  ? atorvastatin (LIPITOR) 20 MG tablet Take 1 tablet (20 mg total) by mouth daily.  ? diltiazem (CARDIZEM CD) 120 MG 24 hr capsule Take 120 mg by mouth daily.  ? ELIQUIS 5 MG TABS tablet Take 5 mg by mouth 2 (two) times daily.  ? loratadine (CLARITIN) 10 MG tablet Take 10 mg by mouth daily.  ?  Multiple Vitamins-Minerals (MULTIVITAMIN ADULT PO) Take 1 tablet by mouth daily.  ? sildenafil (REVATIO) 20 MG tablet 3-5 tablets as needed prior to intercourse, not to exceed 5 tablets in a day  ? valsartan-hydrochlorothiazide (DIOVAN-HCT) 160-12.5 MG tablet TAKE 1 TABLET BY MOUTH  DAILY  ? polyethylene glycol-electrolytes (NULYTELY) 420 g solution Take by mouth. (Patient not taking: Reported on 10/25/2021)  ? ?No facility-administered medications prior to visit.  ? ? ?Review of Systems ? ? ?  Objective  ?  ?BP 130/80   Pulse 78   Resp 16   Wt 191 lb 9.6 oz (86.9 kg)   BMI 28.29 kg/m?  ? ? ?Physical Exam  ? ?General: Appearance:     ?Well developed, well nourished male in no acute distress  ?Eyes:    PERRL, conjunctiva/corneas clear, EOM's intact       ?Lungs:     Clear to auscultation bilaterally, respirations unlabored  ?Heart:    Normal heart rate. Irregularly irregular rhythm. No murmurs, rubs, or gallops.    ?MS:   All extremities are intact.    ?Neurologic:   Awake, alert, oriented x 3. No apparent focal neurological defect.   ?   ?  ?  ?Results for orders placed or performed in visit on 10/25/21  ?POCT HgB A1C  ?Result Value Ref Range  ? Hemoglobin A1C 6.1 (A) 4.0 - 5.6 %  ? Est. average glucose Bld gHb Est-mCnc 128   ?  ? Assessment & Plan  ?  ? ?1. Essential (primary) hypertension ?Well controlled.  Continue current medications.   ?- valsartan-hydrochlorothiazide (DIOVAN-HCT) 160-12.5 MG tablet; Take 1 tablet by mouth daily.  Dispense: 90 tablet; Refill: 3 ? ?2. Prediabetes ?Well controlled with diet and exercise. Continue to check a1c 1-2 x yearly.  ? ?3. Pure hypercholesterolemia ?He is tolerating atorvastatin well with no adverse effects.   ?- CBC ?- Comprehensive metabolic panel ?- Lipid panel ? ?4. Persistent atrial fibrillation ([place]) ?Asymptomatic. Rate well controlled, on DOAC. Followed by Dr. Ubaldo Glassing. Being considered for Watchman device.  ? ?5. Prostate cancer screening ? ?- PSA Total (Reflex To  Free) ([facility] only)  ?   ? ?The entirety of the information documented in the History of Present Illness, Review of Systems and Physical Exam were personally obtained by me. Portions of this information were initially documented by the CMA and reviewed by me for thoroughness and accuracy.   ? ? ?Lelon Huh, MD  ?[facility] ?[phone] (phone) ?[phone] (fax) ? ?[facility]  ?

## 2021-10-25 ENCOUNTER — Ambulatory Visit (INDEPENDENT_AMBULATORY_CARE_PROVIDER_SITE_OTHER): Payer: Medicare Other | Admitting: Family Medicine

## 2021-10-25 ENCOUNTER — Encounter: Payer: Self-pay | Admitting: Family Medicine

## 2021-10-25 VITALS — BP 130/80 | HR 78 | Resp 16 | Wt 191.6 lb

## 2021-10-25 DIAGNOSIS — I10 Essential (primary) hypertension: Secondary | ICD-10-CM

## 2021-10-25 DIAGNOSIS — I4819 Other persistent atrial fibrillation: Secondary | ICD-10-CM | POA: Diagnosis not present

## 2021-10-25 DIAGNOSIS — E7800 Pure hypercholesterolemia, unspecified: Secondary | ICD-10-CM | POA: Diagnosis not present

## 2021-10-25 DIAGNOSIS — Z125 Encounter for screening for malignant neoplasm of prostate: Secondary | ICD-10-CM

## 2021-10-25 DIAGNOSIS — R7303 Prediabetes: Secondary | ICD-10-CM

## 2021-10-25 LAB — POCT GLYCOSYLATED HEMOGLOBIN (HGB A1C)
Est. average glucose Bld gHb Est-mCnc: 128
Hemoglobin A1C: 6.1 % — AB (ref 4.0–5.6)

## 2021-10-25 MED ORDER — VALSARTAN-HYDROCHLOROTHIAZIDE 160-12.5 MG PO TABS: 1.0000 | ORAL_TABLET | Freq: Every day | ORAL | 3 refills | Status: DC

## 2021-10-26 LAB — COMPREHENSIVE METABOLIC PANEL
ALT: 30 IU/L (ref 0–44)
AST: 26 IU/L (ref 0–40)
Albumin/Globulin Ratio: 1.9 (ref 1.2–2.2)
Albumin: 4.7 g/dL (ref 3.7–4.7)
Alkaline Phosphatase: 95 IU/L (ref 44–121)
BUN/Creatinine Ratio: 17 (ref 10–24)
BUN: 17 mg/dL (ref 8–27)
Bilirubin Total: 0.3 mg/dL (ref 0.0–1.2)
CO2: 24 mmol/L (ref 20–29)
Calcium: 9.7 mg/dL (ref 8.6–10.2)
Chloride: 101 mmol/L (ref 96–106)
Creatinine, Ser: 0.98 mg/dL (ref 0.76–1.27)
Globulin, Total: 2.5 g/dL (ref 1.5–4.5)
Glucose: 162 mg/dL — ABNORMAL HIGH (ref 70–99)
Potassium: 4.6 mmol/L (ref 3.5–5.2)
Sodium: 140 mmol/L (ref 134–144)
Total Protein: 7.2 g/dL (ref 6.0–8.5)
eGFR: 81 mL/min/{1.73_m2} (ref >59)

## 2021-10-26 LAB — LIPID PANEL
Chol/HDL Ratio: 3.3 ratio (ref 0.0–5.0)
Cholesterol, Total: 179 mg/dL (ref 100–199)
HDL: 54 mg/dL (ref >39)
LDL Chol Calc (NIH): 109 mg/dL — ABNORMAL HIGH (ref 0–99)
Triglycerides: 84 mg/dL (ref 0–149)
VLDL Cholesterol Cal: 16 mg/dL (ref 5–40)

## 2021-10-26 LAB — CBC
Hematocrit: 47.1 % (ref 37.5–51.0)
Hemoglobin: 16.1 g/dL (ref 13.0–17.7)
MCH: 30.8 pg (ref 26.6–33.0)
MCHC: 34.2 g/dL (ref 31.5–35.7)
MCV: 90 fL (ref 79–97)
Platelets: 252 10*3/uL (ref 150–450)
RBC: 5.23 x10E6/uL (ref 4.14–5.80)
RDW: 13 % (ref 11.6–15.4)
WBC: 5 10*3/uL (ref 3.4–10.8)

## 2021-10-26 LAB — FPSA% REFLEX
% FREE PSA: 16.5 %
PSA, FREE: 1.22 ng/mL

## 2021-10-26 LAB — PSA TOTAL (REFLEX TO FREE): Prostate Specific Ag, Serum: 7.4 ng/mL — ABNORMAL HIGH (ref 0.0–4.0)

## 2021-11-22 ENCOUNTER — Encounter: Payer: Self-pay | Admitting: Family Medicine

## 2021-11-22 ENCOUNTER — Other Ambulatory Visit: Payer: Self-pay | Admitting: Family Medicine

## 2021-11-22 DIAGNOSIS — R972 Elevated prostate specific antigen [PSA]: Secondary | ICD-10-CM

## 2021-12-06 LAB — PSA: Prostate Specific Ag, Serum: 4.5 ng/mL — ABNORMAL HIGH (ref 0.0–4.0)

## 2021-12-08 ENCOUNTER — Encounter: Payer: Self-pay | Admitting: Family Medicine

## 2022-02-03 DIAGNOSIS — I4819 Other persistent atrial fibrillation: Secondary | ICD-10-CM | POA: Diagnosis not present

## 2022-02-07 DIAGNOSIS — C44229 Squamous cell carcinoma of skin of left ear and external auricular canal: Secondary | ICD-10-CM | POA: Diagnosis not present

## 2022-02-07 DIAGNOSIS — D485 Neoplasm of uncertain behavior of skin: Secondary | ICD-10-CM | POA: Diagnosis not present

## 2022-02-07 DIAGNOSIS — L821 Other seborrheic keratosis: Secondary | ICD-10-CM | POA: Diagnosis not present

## 2022-02-07 DIAGNOSIS — D225 Melanocytic nevi of trunk: Secondary | ICD-10-CM | POA: Diagnosis not present

## 2022-02-21 DIAGNOSIS — I10 Essential (primary) hypertension: Secondary | ICD-10-CM | POA: Diagnosis not present

## 2022-02-21 DIAGNOSIS — E782 Mixed hyperlipidemia: Secondary | ICD-10-CM | POA: Diagnosis not present

## 2022-02-21 DIAGNOSIS — I480 Paroxysmal atrial fibrillation: Secondary | ICD-10-CM | POA: Diagnosis not present

## 2022-02-23 ENCOUNTER — Other Ambulatory Visit: Payer: Self-pay | Admitting: Family Medicine

## 2022-02-23 DIAGNOSIS — I10 Essential (primary) hypertension: Secondary | ICD-10-CM

## 2022-03-06 ENCOUNTER — Other Ambulatory Visit: Payer: Self-pay | Admitting: Family Medicine

## 2022-03-06 ENCOUNTER — Encounter: Payer: Self-pay | Admitting: Family Medicine

## 2022-03-06 DIAGNOSIS — R972 Elevated prostate specific antigen [PSA]: Secondary | ICD-10-CM

## 2022-03-21 DIAGNOSIS — L988 Other specified disorders of the skin and subcutaneous tissue: Secondary | ICD-10-CM | POA: Diagnosis not present

## 2022-03-21 DIAGNOSIS — C44229 Squamous cell carcinoma of skin of left ear and external auricular canal: Secondary | ICD-10-CM | POA: Diagnosis not present

## 2022-04-04 DIAGNOSIS — D235 Other benign neoplasm of skin of trunk: Secondary | ICD-10-CM | POA: Diagnosis not present

## 2022-04-04 DIAGNOSIS — L988 Other specified disorders of the skin and subcutaneous tissue: Secondary | ICD-10-CM | POA: Diagnosis not present

## 2022-04-10 DIAGNOSIS — H2513 Age-related nuclear cataract, bilateral: Secondary | ICD-10-CM | POA: Diagnosis not present

## 2022-06-20 ENCOUNTER — Encounter: Payer: Self-pay | Admitting: Family Medicine

## 2022-06-22 LAB — PSA: Prostate Specific Ag, Serum: 3.5 ng/mL (ref 0.0–4.0)

## 2022-06-23 ENCOUNTER — Encounter: Payer: Self-pay | Admitting: Family Medicine

## 2022-08-07 ENCOUNTER — Telehealth: Payer: Self-pay | Admitting: Family Medicine

## 2022-08-07 NOTE — Telephone Encounter (Signed)
Contacted [name] to schedule their annual wellness visit. Appointment made for 09/18/2022.   [place] Direct Dial: [phone]

## 2022-08-26 ENCOUNTER — Other Ambulatory Visit: Payer: Self-pay | Admitting: Family Medicine

## 2022-08-26 DIAGNOSIS — I10 Essential (primary) hypertension: Secondary | ICD-10-CM

## 2022-09-03 DIAGNOSIS — H6121 Impacted cerumen, right ear: Secondary | ICD-10-CM | POA: Diagnosis not present

## 2022-09-18 ENCOUNTER — Ambulatory Visit (INDEPENDENT_AMBULATORY_CARE_PROVIDER_SITE_OTHER): Payer: Medicare Other

## 2022-09-18 VITALS — Ht 69.0 in | Wt 191.0 lb

## 2022-09-18 DIAGNOSIS — Z0000 Encounter for general adult medical examination without abnormal findings: Secondary | ICD-10-CM | POA: Diagnosis not present

## 2022-09-18 NOTE — Patient Instructions (Signed)
Mark Pierce , Thank you for taking time to come for your Medicare Wellness Visit. I appreciate your ongoing commitment to your health goals. Please review the following plan we discussed and let me know if I can assist you in the future.   These are the goals we discussed:  Goals      DIET - EAT MORE FRUITS AND VEGETABLES     Increase water intake     Recommend increasing water intake to 3-4 glasses a day.        This is a list of the screening recommended for you and due dates:  Health Maintenance  Topic Date Due   Zoster (Shingles) Vaccine (1 of 2) Never done   DTaP/Tdap/Td vaccine (2 - Tdap) 06/01/2013   COVID-19 Vaccine (4 - 2023-24 season) 02/10/2022   Flu Shot  01/11/2023   Medicare Annual Wellness Visit  09/18/2023   Colon Cancer Screening  07/20/2031   Pneumonia Vaccine  Completed   Hepatitis C Screening: USPSTF Recommendation to screen - Ages 18-79 yo.  Completed   HPV Vaccine  Aged Out    Advanced directives: yes  Conditions/risks identified: none  Next appointment: Follow up in one year for your annual wellness visit. 09/19/2023 @8 :15am telephone  Preventive Care 65 Years and Older, Male  Preventive care refers to lifestyle choices and visits with your health care provider that can promote health and wellness. What does preventive care include? A yearly physical exam. This is also called an annual well check. Dental exams once or twice a year. Routine eye exams. Ask your health care provider how often you should have your eyes checked. Personal lifestyle choices, including: Daily care of your teeth and gums. Regular physical activity. Eating a healthy diet. Avoiding tobacco and drug use. Limiting alcohol use. Practicing safe sex. Taking low doses of aspirin every day. Taking vitamin and mineral supplements as recommended by your health care provider. What happens during an annual well check? The services and screenings done by your health care provider  during your annual well check will depend on your age, overall health, lifestyle risk factors, and family history of disease. Counseling  Your health care provider may ask you questions about your: Alcohol use. Tobacco use. Drug use. Emotional well-being. Home and relationship well-being. Sexual activity. Eating habits. History of falls. Memory and ability to understand (cognition). Work and work Astron[employer]. Screening  You may have the following tests or measurements: Height, weight, and BMI. Blood pressure. Lipid and cholesterol levels. These may be checked every 5 years, or more frequently if you are over 58 years old. Skin check. Lung cancer screening. You may have this screening every year starting at age 56 if you have a 30-pack-year history of smoking and currently smoke or have quit within the past 15 years. Fecal occult blood test (FOBT) of the stool. You may have this test every year starting at age 70. Flexible sigmoidoscopy or colonoscopy. You may have a sigmoidoscopy every 5 years or a colonoscopy every 10 years starting at age 59. Prostate cancer screening. Recommendations will vary depending on your family history and other risks. Hepatitis C blood test. Hepatitis B blood test. Sexually transmitted disease (STD) testing. Diabetes screening. This is done by checking your blood sugar (glucose) after you have not eaten for a while (fasting). You may have this done every 1-3 years. Abdominal aortic aneurysm (AAA) screening. You may need this if you are a current or former smoker. Osteoporosis. You may be screened starting  at age 31 if you are at high risk. Talk with your health care provider about your test results, treatment options, and if necessary, the need for more tests. Vaccines  Your health care provider may recommend certain vaccines, such as: Influenza vaccine. This is recommended every year. Tetanus, diphtheria, and acellular pertussis (Tdap, Td) vaccine. You  may need a Td booster every 10 years. Zoster vaccine. You may need this after age 55. Pneumococcal 13-valent conjugate (PCV13) vaccine. One dose is recommended after age 70. Pneumococcal polysaccharide (PPSV23) vaccine. One dose is recommended after age 71. Talk to your health care provider about which screenings and vaccines you need and how often you need them. This information is not intended to replace advice given to you by your health care provider. Make sure you discuss any questions you have with your health care provider. Document Released: 06/25/2015 Document Revised: 02/16/2016 Document Reviewed: 03/30/2015 Elsevier Interactive Patient Education  2017 [place] Prevention in the Home Falls can cause injuries. They can happen to people of all ages. There are many things you can do to make your home safe and to help prevent falls. What can I do on the outside of my home? Regularly fix the edges of walkways and driveways and fix any cracks. Remove anything that might make you trip as you walk through a door, such as a raised step or threshold. Trim any bushes or trees on the path to your home. Use bright outdoor lighting. Clear any walking paths of anything that might make someone trip, such as rocks or tools. Regularly check to see if handrails are loose or broken. Make sure that both sides of any steps have handrails. Any raised decks and porches should have guardrails on the edges. Have any leaves, snow, or ice cleared regularly. Use sand or salt on walking paths during winter. Clean up any spills in your garage right away. This includes oil or grease spills. What can I do in the bathroom? Use night lights. Install grab bars by the toilet and in the tub and shower. Do not use towel bars as grab bars. Use non-skid mats or decals in the tub or shower. If you need to sit down in the shower, use a plastic, non-slip stool. Keep the floor dry. Clean up any water that spills  on the floor as soon as it happens. Remove soap buildup in the tub or shower regularly. Attach bath mats securely with double-sided non-slip rug tape. Do not have throw rugs and other things on the floor that can make you trip. What can I do in the bedroom? Use night lights. Make sure that you have a light by your bed that is easy to reach. Do not use any sheets or blankets that are too big for your bed. They should not hang down onto the floor. Have a firm chair that has side arms. You can use this for support while you get dressed. Do not have throw rugs and other things on the floor that can make you trip. What can I do in the kitchen? Clean up any spills right away. Avoid walking on wet floors. Keep items that you use a lot in easy-to-reach places. If you need to reach something above you, use a strong step stool that has a grab bar. Keep electrical cords out of the way. Do not use floor polish or wax that makes floors slippery. If you must use wax, use non-skid floor wax. Do not have throw rugs  and other things on the floor that can make you trip. What can I do with my stairs? Do not leave any items on the stairs. Make sure that there are handrails on both sides of the stairs and use them. Fix handrails that are broken or loose. Make sure that handrails are as long as the stairways. Check any carpeting to make sure that it is firmly attached to the stairs. Fix any carpet that is loose or worn. Avoid having throw rugs at the top or bottom of the stairs. If you do have throw rugs, attach them to the floor with carpet tape. Make sure that you have a light switch at the top of the stairs and the bottom of the stairs. If you do not have them, ask someone to add them for you. What else can I do to help prevent falls? Wear shoes that: Do not have high heels. Have rubber bottoms. Are comfortable and fit you well. Are closed at the toe. Do not wear sandals. If you use a stepladder: Make  sure that it is fully opened. Do not climb a closed stepladder. Make sure that both sides of the stepladder are locked into place. Ask someone to hold it for you, if possible. Clearly mark and make sure that you can see: Any grab bars or handrails. First and last steps. Where the edge of each step is. Use tools that help you move around (mobility aids) if they are needed. These include: Canes. Walkers. Scooters. Crutches. Turn on the lights when you go into a dark area. Replace any light bulbs as soon as they burn out. Set up your furniture so you have a clear path. Avoid moving your furniture around. If any of your floors are uneven, fix them. If there are any pets around you, be aware of where they are. Review your medicines with your doctor. Some medicines can make you feel dizzy. This can increase your chance of falling. Ask your doctor what other things that you can do to help prevent falls. This information is not intended to replace advice given to you by your health care provider. Make sure you discuss any questions you have with your health care provider. Document Released: 03/25/2009 Document Revised: 11/04/2015 Document Reviewed: 07/03/2014 Elsevier Interactive Patient Education  2017 [employer].

## 2022-09-18 NOTE — Progress Notes (Addendum)
I connected with  Almyra Free on 09/18/22 by a audio enabled telemedicine application and verified that I am speaking with the correct person using two identifiers.  Patient Location: Home  Provider Location: Office/Clinic  I discussed the limitations of evaluation and management by telemedicine. The patient expressed understanding and agreed to proceed.  Subjective:   Mark Pierce is a 72 y.o. male who presents for Medicare Annual/Subsequent preventive examination.  Review of Systems    Cardiac Risk Factors include: advanced age (>12men, >16 women);dyslipidemia;hypertension;male gender    Objective:    Today's Vitals   09/18/22 0914  Weight: 191 lb (86.6 kg)  Height: 5\' 9"  (1.753 m)   Body mass index is 28.21 kg/m.     09/18/2022    9:26 AM 08/15/2021    9:45 AM 07/19/2021    9:45 AM 01/08/2020    2:17 PM 01/07/2019    3:12 PM 09/06/2017    8:52 AM 08/24/2016    9:16 AM  Advanced Directives  Does Patient Have a Medical Advance Directive? Yes No No Yes Yes Yes Yes  Type of Aeronautical engineer of [place];Living will Healthcare Power of [place];Living will Healthcare Power of [place];Living will Healthcare Power of [place];Living will  Copy of Healthcare Power of Attorney in Chart?    No - copy requested No - copy requested No - copy requested No - copy requested  Would patient like information on creating a medical advance directive?  No - Patient declined         Current Medications (verified) Outpatient Encounter Medications as of 09/18/2022  Medication Sig   apixaban (ELIQUIS) 5 MG TABS tablet Take 1 tablet by mouth 2 (two) times daily.   atorvastatin (LIPITOR) 20 MG tablet TAKE 1 TABLET BY MOUTH DAILY   diltiazem (CARDIZEM CD) 120 MG 24 hr capsule Take 120 mg by mouth daily.   ELIQUIS 5 MG TABS tablet Take 5 mg by mouth 2 (two) times daily.   loratadine (CLARITIN) 10 MG tablet Take 10 mg by mouth daily.   Multiple Vitamins-Minerals (MULTIVITAMIN  ADULT PO) Take 1 tablet by mouth daily.   valsartan-hydrochlorothiazide (DIOVAN-HCT) 160-12.5 MG tablet TAKE 1 TABLET BY MOUTH DAILY   sildenafil (REVATIO) 20 MG tablet 3-5 tablets as needed prior to intercourse, not to exceed 5 tablets in a day   No facility-administered encounter medications on file as of 09/18/2022.    Allergies (verified) Lisinopril   History: Past Medical History:  Diagnosis Date   A-fib    History of chicken pox    Hyperlipidemia    Hypertension    Past Surgical History:  Procedure Laterality Date   APPENDECTOMY  06/13/1979   COLONOSCOPY N/A 07/19/2021   Procedure: COLONOSCOPY;  Surgeon: Regis Bill, MD;  Location: [facility] ENDOSCOPY;  Service: Endoscopy;  Laterality: N/A;   HERNIA REPAIR     SPINAL FUSION  06/12/2006   patient stated that he had a cervical spine infusion    WISDOM TOOTH EXTRACTION     Family History  Problem Relation Age of Onset   CAD Father    Colitis Father    Heart attack Father    Hyperlipidemia Brother    Mitral valve prolapse Brother    Heart attack Paternal Grandmother    Social History   Socioeconomic History   Marital status: Married    Spouse name: Not on file   Number of children: 2   Years of education: Not on file  Highest education level: Bachelor's degree (e.g., BA, AB, BS)  Occupational History   Occupation: Retired    Comment: 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 Retired in 2015  Tobacco Use   Smoking status: Former    Packs/day: 1    Types: Cigarettes    Quit date: 06/12/1998    Years since quitting: 24.2   Smokeless tobacco: Never  Vaping Use   Vaping Use: Never used  Substance and Sexual Activity   Alcohol use: Yes    Alcohol/week: 21.0 standard drinks of alcohol    Types: 7 Glasses of wine, 4 Cans of beer, 10 Shots of liquor per week    Comment: moderate use   Drug use: Yes    Types: Marijuana   Sexual activity: Yes  Other Topics Concern   Not on file  Social History Narrative   Not on file    Social Determinants of Health   Financial Resource Strain: Low Risk  (09/18/2022)   Overall Financial Resource Strain (CARDIA)    Difficulty of Paying Living Expenses: Not hard at all  Food Insecurity: No Food Insecurity (09/18/2022)   Hunger Vital Sign    Worried About Running Out of Food in the Last Year: Never true    Ran Out of Food in the Last Year: Never true  Transportation Needs: No Transportation Needs (09/18/2022)   PRAPARE - Administrator, Civil Service (Medical): No    Lack of Transportation (Non-Medical): No  Physical Activity: Sufficiently Active (09/18/2022)   Exercise Vital Sign    Days of Exercise per Week: 4 days    Minutes of Exercise per Session: 60 min  Stress: No Stress Concern Present (09/18/2022)   [employer] of Occupational Health - Occupational Stress Questionnaire    Feeling of Stress : Not at all  Social Connections: Moderately Integrated (09/18/2022)   Social Connection and Isolation Panel [NHANES]    Frequency of Communication with Friends and Family: More than three times a week    Frequency of Social Gatherings with Friends and Family: Twice a week    Attends Religious Services: Never    Database administrator or Organizations: Yes    Attends Engineer, structural: More than 4 times per year    Marital Status: Married    Tobacco Counseling Counseling given: Not Answered   Clinical Intake:  Pre-visit preparation completed: Yes  Pain : No/denies pain     BMI - recorded: 28.21 Nutritional Status: BMI 25 -29 Overweight Nutritional Risks: None Diabetes: No  How often do you need to have someone help you when you read instructions, pamphlets, or other written materials from your doctor or pharmacy?: 1 - Never  Diabetic?no  Interpreter Needed?: No  Comments: lives w/wife Information entered by :: B.Alaja Goldinger,LPN   Activities of Daily Living    09/18/2022    9:26 AM  In your present state of health, do you have any  difficulty performing the following activities:  Preparing Food and eating ? N  Using the Toilet? N  In the past six months, have you accidently leaked urine? N  Do you have problems with loss of bowel control? N  Managing your Medications? N  Managing your Finances? N  Housekeeping or managing your Housekeeping? N    Patient Care Team: Malva Limes, MD as PCP - General (Family Medicine) Lady Gary Darlin Priestly, MD as Consulting Physician (Cardiology) Jesusita Oka, MD (Dermatology) [place], [facility]  Indicate any recent Medical Services  you may have received from other than [facility] providers in the past year (date may be approximate).     Assessment:   This is a routine wellness examination for Mark Pierce.  Hearing/Vision screen Hearing Screening - Comments:: Adequate hearing w/hearing aids Vision Screening - Comments:: Adequate vision w/glasses [facility]  Dietary issues and exercise activities discussed: Current Exercise Habits: Structured exercise class, Type of exercise: strength training/weights;stretching;treadmill;walking, Time (Minutes): > 60, Frequency (Times/Week): 4, Weekly Exercise (Minutes/Week): 0, Intensity: Moderate, Exercise limited by: None identified   Goals Addressed             This Visit's Progress    DIET - EAT MORE FRUITS AND VEGETABLES   On track    Increase water intake   On track    Recommend increasing water intake to 3-4 glasses a day.       Depression Screen    09/18/2022    9:20 AM 08/15/2021    9:44 AM 06/29/2020    3:23 PM 01/08/2020    2:13 PM 01/07/2019    3:13 PM 09/06/2017    9:20 AM 09/06/2017    8:54 AM  PHQ 2/9 Scores  PHQ - 2 Score 0 0 0 0 0 0 0  PHQ- 9 Score   0   0     Fall Risk    09/18/2022    9:17 AM 08/15/2021    9:46 AM 08/14/2021    3:24 PM 06/29/2020    3:23 PM 01/08/2020    2:17 PM  Fall Risk   Falls in the past year? 0 0 0 0 0  Number falls in past yr: 0 0 0 0 0  Injury with Fall? 0 0 0 0 0  Risk for fall  due to : No Fall Risks No Fall Risks     Follow up Education provided;Falls prevention discussed Falls evaluation completed  Falls evaluation completed     FALL RISK PREVENTION PERTAINING TO THE HOME:  Any stairs in or around the home? Yes  If so, are there any without handrails? Yes  Home free of loose throw rugs in walkways, pet beds, electrical cords, etc? Yes  Adequate lighting in your home to reduce risk of falls? Yes   ASSISTIVE DEVICES UTILIZED TO PREVENT FALLS:  Life alert? No  Use of a cane, walker or w/c? No  Grab bars in the bathroom? Yes  Shower chair or bench in shower? Yes  Elevated toilet seat or a handicapped toilet? yes   Cognitive Function:        09/18/2022    9:28 AM 01/07/2019    3:21 PM 08/24/2016    9:22 AM  6CIT Screen  What Year? 0 points 0 points 0 points  What month? 0 points 0 points 0 points  What time? 0 points 0 points 0 points  Count back from 20 0 points 0 points 0 points  Months in reverse 0 points 0 points 0 points  Repeat phrase 0 points 0 points 0 points  Total Score 0 points 0 points 0 points    Immunizations Immunization History  Administered Date(s) Administered   Fluad Quad(high Dose 65+) 06/14/2022   H1N1 04/27/2008   Influenza, High Dose Seasonal PF 03/11/2017, 03/21/2019, 04/26/2020   Influenza,inj,quad, With Preservative 03/26/2021   Influenza-Unspecified 04/13/2015, 03/26/2018   Moderna Sars-Covid-2 Vaccination 07/18/2019, 08/15/2019, 04/06/2020   Pneumococcal Conjugate-13 10/06/2013   Pneumococcal Polysaccharide-23 04/28/2015   Td 06/02/2003   Zoster, Live 03/08/2013    TDAP status:  Up to date  Flu Vaccine status: Up to date  Pneumococcal vaccine status: Up to date  Covid-19 vaccine status: Completed vaccines  Qualifies for Shingles Vaccine? Yes   Zostavax completed Yes   Shingrix Completed?: Yes  Screening Tests Health Maintenance  Topic Date Due   Zoster Vaccines- Shingrix (1 of 2) Never done    DTaP/Tdap/Td (2 - Tdap) 06/01/2013   COVID-19 Vaccine (4 - 2023-24 season) 02/10/2022   INFLUENZA VACCINE  01/11/2023   Medicare Annual Wellness (AWV)  09/18/2023   COLONOSCOPY (Pts [id number] Insurance coverage will need to be confirmed)  07/20/2031   Pneumonia Vaccine 6865+ Years old  Completed   Hepatitis C Screening  Completed   HPV VACCINES  Aged Out    Health Maintenance  Health Maintenance Due  Topic Date Due   Zoster Vaccines- Shingrix (1 of 2) Never done   DTaP/Tdap/Td (2 - Tdap) 06/01/2013   COVID-19 Vaccine (4 - 2023-24 season) 02/10/2022    Colorectal cancer screening: Type of screening: Colonoscopy. Completed yes. Repeat every 5-10 years aged out  Lung Cancer Screening: (Low Dose CT Chest recommended if Age 79-80 years, 30 pack-year currently smoking OR have quit w/in 15years.) does not qualify.   Lung Cancer Screening Referral: no  Additional Screening:  Hepatitis C Screening: does not qualify; Completed yes  Vision Screening: Recommended annual ophthalmology exams for early detection of glaucoma and other disorders of the eye. Is the patient up to date with their annual eye exam?  Yes  Who is the provider or what is the name of the office in which the patient attends annual eye exams? Dr Alexia FreestonePatty If pt is not established with a provider, would they like to be referred to a provider to establish care? No .   Dental Screening: Recommended annual dental exams for proper oral hygiene  Community Resource Referral / Chronic Care Management: CRR required this visit?  No   CCM required this visit?  No      Plan:     I have personally reviewed and noted the following in the patient's chart:   Medical and social history Use of alcohol, tobacco or illicit drugs  Current medications and supplements including opioid prescriptions. Patient is not currently taking opioid prescriptions. Functional ability and status Nutritional status Physical activity Advanced  directives List of other physicians Hospitalizations, surgeries, and ER visits in previous 12 months Vitals Screenings to include cognitive, depression, and falls Referrals and appointments  In addition, I have reviewed and discussed with patient certain preventive protocols, quality metrics, and best practice recommendations. A written personalized care plan for preventive services as well as general preventive health recommendations were provided to patient.     Sue LushBrenda L Marikay Roads, LPN   9/6/04544/01/2023   Nurse Notes: The patient states that he is doing well and has no concerns or questions at this time.

## 2022-09-20 ENCOUNTER — Encounter: Payer: Self-pay | Admitting: Family Medicine

## 2022-10-27 ENCOUNTER — Encounter: Payer: Medicare Other | Admitting: Family Medicine

## 2022-11-10 ENCOUNTER — Other Ambulatory Visit: Payer: Self-pay | Admitting: Family Medicine

## 2022-11-10 DIAGNOSIS — I10 Essential (primary) hypertension: Secondary | ICD-10-CM

## 2022-11-13 NOTE — Telephone Encounter (Signed)
Requested Prescriptions  Refused Prescriptions Disp Refills   atorvastatin (LIPITOR) 20 MG tablet [Pharmacy Med Name: Atorvastatin Calcium 20 MG Oral Tablet] 100 tablet 2    Sig: TAKE 1 TABLET BY MOUTH ONCE  DAILY     Cardiovascular:  Antilipid - Statins Failed - 11/10/2022 10:11 PM      Failed - Lipid Panel in normal range within the last 12 months    Cholesterol, Total  Date Value Ref Range Status  10/25/2021 179 100 - 199 mg/dL Final   LDL Chol Calc (NIH)  Date Value Ref Range Status  10/25/2021 109 (H) 0 - 99 mg/dL Final   HDL  Date Value Ref Range Status  10/25/2021 54 >39 mg/dL Final   Triglycerides  Date Value Ref Range Status  10/25/2021 84 0 - 149 mg/dL Final         Passed - Patient is not pregnant      Passed - Valid encounter within last 12 months    Recent Outpatient Visits           1 year ago Essential (primary) hypertension   [facility] Malva Limes, MD   2 years ago Annual physical exam   [facility] Malva Limes, MD   3 years ago Annual physical exam   [facility] Malva Limes, MD   4 years ago Chronic atrial fibrillation [facility])   [facility] Malva Limes, MD   6 years ago Annual physical exam   [place] Malva Limes, MD       Future Appointments             In 1 week Fisher, Demetrios Isaacs, MD [facility]

## 2022-11-14 ENCOUNTER — Encounter: Payer: Medicare Other | Admitting: Family Medicine

## 2022-11-14 ENCOUNTER — Other Ambulatory Visit: Payer: Self-pay | Admitting: Family Medicine

## 2022-11-14 DIAGNOSIS — I10 Essential (primary) hypertension: Secondary | ICD-10-CM

## 2022-11-14 NOTE — Telephone Encounter (Signed)
Requested medication (s) are due for refill today: Yes  Requested medication (s) are on the active medication list: Yes  Last refill:  08/26/22  Future visit scheduled: Yes  Notes to clinic:  Protocol indicates lab work needed.    Requested Prescriptions  Pending Prescriptions Disp Refills   valsartan-hydrochlorothiazide (DIOVAN-HCT) 160-12.5 MG tablet [Pharmacy Med Name: Valsartan-hydroCHLOROthiazide 160-12.5 MG Oral Tablet] 100 tablet 2    Sig: TAKE 1 TABLET BY MOUTH DAILY     Cardiovascular: ARB + Diuretic Combos Failed - 11/14/2022  4:46 AM      Failed - K in normal range and within 180 days    Potassium  Date Value Ref Range Status  10/25/2021 4.6 3.5 - 5.2 mmol/L Final         Failed - Na in normal range and within 180 days    Sodium  Date Value Ref Range Status  10/25/2021 140 134 - 144 mmol/L Final         Failed - Cr in normal range and within 180 days    Creatinine, Ser  Date Value Ref Range Status  10/25/2021 0.98 0.76 - 1.27 mg/dL Final         Failed - eGFR is 10 or above and within 180 days    GFR calc Af Amer  Date Value Ref Range Status  06/30/2020 79 >59 mL/min/1.73 Final    Comment:    **In accordance with recommendations from the NKF-ASN Task force,**   [facility] is in the process of updating its eGFR calculation to the   2021 CKD-EPI creatinine equation that estimates kidney function   without a race variable.    GFR calc non Af Amer  Date Value Ref Range Status  06/30/2020 69 >59 mL/min/1.73 Final   eGFR  Date Value Ref Range Status  10/25/2021 81 >59 mL/min/1.73 Final         Passed - Patient is not pregnant      Passed - Last BP in normal range    BP Readings from Last 1 Encounters:  10/25/21 130/80         Passed - Valid encounter within last 6 months    Recent Outpatient Visits           1 year ago Essential (primary) hypertension   [facility] Malva Limes, MD   2 years ago Annual physical exam    [facility] Malva Limes, MD   3 years ago Annual physical exam   [facility] Malva Limes, MD   4 years ago Chronic atrial fibrillation [facility])   [facility] Malva Limes, MD   6 years ago Annual physical exam   [facility] Malva Limes, MD       Future Appointments             In 1 week Fisher, Demetrios Isaacs, MD [facility]

## 2022-11-20 NOTE — Progress Notes (Unsigned)
I,Mark Pierce,acting as a Neurosurgeon for Mark Merry, MD.,have documented all relevant documentation on the behalf of Mark Merry, MD,as directed by  Mark Merry, MD while in the presence of Mark Merry, MD.    Complete physical exam   Patient: Mark Pierce   DOB: [birth year]   72 y.o. Male  MRN: [id number] Visit Date: 11/21/2022  Today's healthcare provider: [name], MD   No chief complaint on file.  Subjective    [name] is a 72 y.o. male who presents today for a complete physical exam.  He reports consuming a {diet types:17450} diet. Gym/ health club routine includes cardio. He generally feels {well/fairly well/poorly:18703}. He reports sleeping {well/fairly well/poorly:18703}. He {does/does not:200015} have additional problems to discuss today.  HPI  09/18/22 AWV  Past Medical History:  Diagnosis Date   A-fib [facility])    History of chicken pox    Hyperlipidemia    Hypertension    Past Surgical History:  Procedure Laterality Date   APPENDECTOMY  06/13/1979   COLONOSCOPY N/A 07/19/2021   Procedure: COLONOSCOPY;  Surgeon: Regis Bill, MD;  Location: [facility] ENDOSCOPY;  Service: Endoscopy;  Laterality: N/A;   HERNIA REPAIR     SPINAL FUSION  06/12/2006   patient stated that he had a cervical spine infusion    WISDOM TOOTH EXTRACTION     Social History   Socioeconomic History   Marital status: Married    Spouse name: Not on file   Number of children: 2   Years of education: Not on file   Highest education level: Bachelor's degree (e.g., BA, AB, BS)  Occupational History   Occupation: Retired    Comment: 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 Retired in 2015  Tobacco Use   Smoking status: Former    Packs/day: 1    Types: Cigarettes    Quit date: 06/12/1998    Years since quitting: 24.4   Smokeless tobacco: Never  Vaping Use   Vaping Use: Never used  Substance and Sexual Activity   Alcohol use: Yes    Alcohol/week: 21.0 standard drinks of  alcohol    Types: 7 Glasses of wine, 4 Cans of beer, 10 Shots of liquor per week    Comment: moderate use   Drug use: Yes    Types: Marijuana   Sexual activity: Yes  Other Topics Concern   Not on file  Social History Narrative   Not on file   Social Determinants of Health   Financial Resource Strain: Low Risk  (09/18/2022)   Overall Financial Resource Strain (CARDIA)    Difficulty of Paying Living Expenses: Not hard at all  Food Insecurity: No Food Insecurity (09/18/2022)   Hunger Vital Sign    Worried About Running Out of Food in the Last Year: Never true    Ran Out of Food in the Last Year: Never true  Transportation Needs: No Transportation Needs (09/18/2022)   PRAPARE - Administrator, Civil Service (Medical): No    Lack of Transportation (Non-Medical): No  Physical Activity: Sufficiently Active (09/18/2022)   Exercise Vital Sign    Days of Exercise per Week: 4 days    Minutes of Exercise per Session: 60 min  Stress: No Stress Concern Present (09/18/2022)   [employer] of Occupational Health - Occupational Stress Questionnaire    Feeling of Stress : Not at all  Social Connections: Moderately Integrated (09/18/2022)   Social Connection and Isolation Panel [NHANES]    Frequency of  Communication with Friends and Family: More than three times a week    Frequency of Social Gatherings with Friends and Family: Twice a week    Attends Religious Services: Never    Database administrator or Organizations: Yes    Attends Engineer, structural: More than 4 times per year    Marital Status: Married  Catering manager Violence: Not At Risk (09/18/2022)   Humiliation, Afraid, Rape, and Kick questionnaire    Fear of Current or Ex-Partner: No    Emotionally Abused: No    Physically Abused: No    Sexually Abused: No   Family Status  Relation Name Status   Mother  Deceased       has colorectal adenoma, natural causes for death   Father  Deceased       also had Colorectal  Adenoma and had Bypass   Brother  Alive   Son  Alive   Son  Alive   PGM  (Not Specified)   Family History  Problem Relation Age of Onset   CAD Father    Colitis Father    Heart attack Father    Hyperlipidemia Brother    Mitral valve prolapse Brother    Heart attack Paternal Grandmother    Allergies  Allergen Reactions   Lisinopril Cough    dry cough dry cough    Patient Care Team: Malva Limes, MD as PCP - General (Family Medicine) Lady Gary, Darlin Priestly, MD as Consulting Physician (Cardiology) Jesusita Oka, MD (Dermatology) Pa, [facility]   Medications: Outpatient Medications Prior to Visit  Medication Sig   apixaban (ELIQUIS) 5 MG TABS tablet Take 1 tablet by mouth 2 (two) times daily.   atorvastatin (LIPITOR) 20 MG tablet TAKE 1 TABLET BY MOUTH DAILY   diltiazem (CARDIZEM CD) 120 MG 24 hr capsule Take 120 mg by mouth daily.   ELIQUIS 5 MG TABS tablet Take 5 mg by mouth 2 (two) times daily.   loratadine (CLARITIN) 10 MG tablet Take 10 mg by mouth daily.   Multiple Vitamins-Minerals (MULTIVITAMIN ADULT PO) Take 1 tablet by mouth daily.   sildenafil (REVATIO) 20 MG tablet 3-5 tablets as needed prior to intercourse, not to exceed 5 tablets in a day   valsartan-hydrochlorothiazide (DIOVAN-HCT) 160-12.5 MG tablet TAKE 1 TABLET BY MOUTH DAILY   No facility-administered medications prior to visit.    Review of Systems  All other systems reviewed and are negative.   Last CBC Lab Results  Component Value Date   WBC 5.0 10/25/2021   HGB 16.1 10/25/2021   HCT 47.1 10/25/2021   MCV 90 10/25/2021   MCH 30.8 10/25/2021   RDW 13.0 10/25/2021   PLT 252 10/25/2021   Last metabolic panel Lab Results  Component Value Date   GLUCOSE 162 (H) 10/25/2021   NA 140 10/25/2021   K 4.6 10/25/2021   CL 101 10/25/2021   CO2 24 10/25/2021   BUN 17 10/25/2021   CREATININE 0.98 10/25/2021   EGFR 81 10/25/2021   CALCIUM 9.7 10/25/2021   PROT 7.2 10/25/2021    ALBUMIN 4.7 10/25/2021   LABGLOB 2.5 10/25/2021   AGRATIO 1.9 10/25/2021   BILITOT 0.3 10/25/2021   ALKPHOS 95 10/25/2021   AST 26 10/25/2021   ALT 30 10/25/2021   Last lipids Lab Results  Component Value Date   CHOL 179 10/25/2021   HDL 54 10/25/2021   LDLCALC 109 (H) 10/25/2021   TRIG 84 10/25/2021   CHOLHDL 3.3  10/25/2021   Last hemoglobin A1c Lab Results  Component Value Date   HGBA1C 6.1 (A) 10/25/2021   Last thyroid functions Lab Results  Component Value Date   TSH 1.390 11/23/2017      Objective    There were no vitals taken for this visit. BP Readings from Last 3 Encounters:  10/25/21 130/80  07/19/21 118/76  06/29/20 124/76   Wt Readings from Last 3 Encounters:  09/18/22 191 lb (86.6 kg)  10/25/21 191 lb 9.6 oz (86.9 kg)  08/15/21 188 lb (85.3 kg)       Physical Exam  ***  Last depression screening scores    09/18/2022    9:20 AM 08/15/2021    9:44 AM 06/29/2020    3:23 PM  PHQ 2/9 Scores  PHQ - 2 Score 0 0 0  PHQ- 9 Score   0   Last fall risk screening    09/18/2022    9:17 AM  Fall Risk   Falls in the past year? 0  Number falls in past yr: 0  Injury with Fall? 0  Risk for fall due to : No Fall Risks  Follow up Education provided;Falls prevention discussed   Last Audit-C alcohol use screening    09/18/2022    9:20 AM  Alcohol Use Disorder Test (AUDIT)  1. How often do you have a drink containing alcohol? 0  2. How many drinks containing alcohol do you have on a typical day when you are drinking? 0  3. How often do you have six or more drinks on one occasion? 0  AUDIT-C Score 0   A score of 3 or more in women, and 4 or more in men indicates increased risk for alcohol abuse, EXCEPT if all of the points are from question 1   No results found for any visits on 11/21/22.  Assessment & Plan    Routine Health Maintenance and Physical Exam  Exercise Activities and Dietary recommendations  Goals      DIET - EAT MORE FRUITS AND VEGETABLES      Increase water intake     Recommend increasing water intake to 3-4 glasses a day.        Immunization History  Administered Date(s) Administered   Fluad Quad(high Dose 65+) 06/14/2022   H1N1 04/27/2008   Influenza, High Dose Seasonal PF 03/11/2017, 03/21/2019, 04/26/2020   Influenza,inj,quad, With Preservative 03/26/2021   Influenza-Unspecified 04/13/2015, 03/26/2018   Moderna Sars-Covid-2 Vaccination 07/18/2019, 08/15/2019, 04/06/2020   Pneumococcal Conjugate-13 10/06/2013   Pneumococcal Polysaccharide-23 04/28/2015   Td 06/02/2003   Zoster, Live 03/08/2013    Health Maintenance  Topic Date Due   Zoster Vaccines- Shingrix (1 of 2) Never done   DTaP/Tdap/Td (2 - Tdap) 06/01/2013   COVID-19 Vaccine (4 - 2023-24 season) 02/10/2022   INFLUENZA VACCINE  01/11/2023   Medicare Annual Wellness (AWV)  09/18/2023   Colonoscopy  07/20/2031   Pneumonia Vaccine 37+ Years old  Completed   Hepatitis C Screening  Completed   HPV VACCINES  Aged Out    Discussed health benefits of physical activity, and encouraged him to engage in regular exercise appropriate for his age and condition.  ***  No follow-ups on file.     {provider attestation***:1}   Mark Merry, MD  [facility] [phone] (phone) [phone] (fax)  [facility]

## 2022-11-21 ENCOUNTER — Ambulatory Visit (INDEPENDENT_AMBULATORY_CARE_PROVIDER_SITE_OTHER): Payer: Medicare Other | Admitting: Family Medicine

## 2022-11-21 ENCOUNTER — Encounter: Payer: Self-pay | Admitting: Family Medicine

## 2022-11-21 VITALS — BP 113/71 | HR 84 | Temp 98.2°F | Resp 12 | Ht 69.0 in | Wt 189.4 lb

## 2022-11-21 DIAGNOSIS — H9193 Unspecified hearing loss, bilateral: Secondary | ICD-10-CM

## 2022-11-21 DIAGNOSIS — E7800 Pure hypercholesterolemia, unspecified: Secondary | ICD-10-CM | POA: Diagnosis not present

## 2022-11-21 DIAGNOSIS — I10 Essential (primary) hypertension: Secondary | ICD-10-CM

## 2022-11-21 DIAGNOSIS — R7303 Prediabetes: Secondary | ICD-10-CM | POA: Diagnosis not present

## 2022-11-21 DIAGNOSIS — I4819 Other persistent atrial fibrillation: Secondary | ICD-10-CM

## 2022-11-21 DIAGNOSIS — Z86010 Personal history of colonic polyps: Secondary | ICD-10-CM

## 2022-11-21 DIAGNOSIS — Z0000 Encounter for general adult medical examination without abnormal findings: Secondary | ICD-10-CM | POA: Diagnosis not present

## 2022-11-21 NOTE — Patient Instructions (Signed)
Please review the attached list of medications and notify my office if there are any errors.   You are due for a Tdap (tetanus-diptheria-pertussis vaccine) which protects you from tetanus and whooping cough. Please check with your insurance plan or pharmacy regarding coverage for this vaccine.   

## 2022-11-22 ENCOUNTER — Other Ambulatory Visit: Payer: Self-pay | Admitting: Family Medicine

## 2022-11-22 DIAGNOSIS — I10 Essential (primary) hypertension: Secondary | ICD-10-CM

## 2022-11-22 LAB — HEMOGLOBIN A1C
Est. average glucose Bld gHb Est-mCnc: 143 mg/dL
Hgb A1c MFr Bld: 6.6 % — ABNORMAL HIGH (ref 4.8–5.6)

## 2022-11-22 LAB — CBC
Hematocrit: 43.8 % (ref 37.5–51.0)
Hemoglobin: 15.3 g/dL (ref 13.0–17.7)
MCH: 30.8 pg (ref 26.6–33.0)
MCHC: 34.9 g/dL (ref 31.5–35.7)
MCV: 88 fL (ref 79–97)
Platelets: 271 10*3/uL (ref 150–450)
RBC: 4.96 x10E6/uL (ref 4.14–5.80)
RDW: 12.6 % (ref 11.6–15.4)
WBC: 4.3 10*3/uL (ref 3.4–10.8)

## 2022-11-22 LAB — COMPREHENSIVE METABOLIC PANEL
ALT: 27 IU/L (ref 0–44)
AST: 24 IU/L (ref 0–40)
Albumin/Globulin Ratio: 1.8
Albumin: 4.5 g/dL (ref 3.8–4.8)
Alkaline Phosphatase: 97 IU/L (ref 44–121)
BUN/Creatinine Ratio: 21 (ref 10–24)
BUN: 24 mg/dL (ref 8–27)
Bilirubin Total: 0.5 mg/dL (ref 0.0–1.2)
CO2: 20 mmol/L (ref 20–29)
Calcium: 9.1 mg/dL (ref 8.6–10.2)
Chloride: 104 mmol/L (ref 96–106)
Creatinine, Ser: 1.15 mg/dL (ref 0.76–1.27)
Globulin, Total: 2.5 g/dL (ref 1.5–4.5)
Glucose: 140 mg/dL — ABNORMAL HIGH (ref 70–99)
Potassium: 4.6 mmol/L (ref 3.5–5.2)
Sodium: 143 mmol/L (ref 134–144)
Total Protein: 7 g/dL (ref 6.0–8.5)
eGFR: 67 mL/min/{1.73_m2} (ref >59)

## 2022-11-22 LAB — LIPID PANEL
Chol/HDL Ratio: 4 ratio (ref 0.0–5.0)
Cholesterol, Total: 166 mg/dL (ref 100–199)
HDL: 42 mg/dL (ref >39)
LDL Chol Calc (NIH): 102 mg/dL — ABNORMAL HIGH (ref 0–99)
Triglycerides: 120 mg/dL (ref 0–149)
VLDL Cholesterol Cal: 22 mg/dL (ref 5–40)

## 2022-11-22 MED ORDER — VALSARTAN-HYDROCHLOROTHIAZIDE 160-12.5 MG PO TABS: 1.0000 | ORAL_TABLET | Freq: Every day | ORAL | 0 refills | Status: DC

## 2023-01-06 ENCOUNTER — Other Ambulatory Visit: Payer: Self-pay | Admitting: Family Medicine

## 2023-01-06 DIAGNOSIS — I10 Essential (primary) hypertension: Secondary | ICD-10-CM

## 2023-01-11 DIAGNOSIS — L578 Other skin changes due to chronic exposure to nonionizing radiation: Secondary | ICD-10-CM | POA: Diagnosis not present

## 2023-01-11 DIAGNOSIS — C4441 Basal cell carcinoma of skin of scalp and neck: Secondary | ICD-10-CM | POA: Diagnosis not present

## 2023-01-11 DIAGNOSIS — L570 Actinic keratosis: Secondary | ICD-10-CM | POA: Diagnosis not present

## 2023-01-11 DIAGNOSIS — C44212 Basal cell carcinoma of skin of right ear and external auricular canal: Secondary | ICD-10-CM | POA: Diagnosis not present

## 2023-01-11 DIAGNOSIS — D485 Neoplasm of uncertain behavior of skin: Secondary | ICD-10-CM | POA: Diagnosis not present

## 2023-01-11 DIAGNOSIS — Z859 Personal history of malignant neoplasm, unspecified: Secondary | ICD-10-CM | POA: Diagnosis not present

## 2023-01-11 DIAGNOSIS — Z86018 Personal history of other benign neoplasm: Secondary | ICD-10-CM | POA: Diagnosis not present

## 2023-01-21 ENCOUNTER — Other Ambulatory Visit: Payer: Self-pay | Admitting: Family Medicine

## 2023-01-21 DIAGNOSIS — I10 Essential (primary) hypertension: Secondary | ICD-10-CM

## 2023-01-23 NOTE — Telephone Encounter (Signed)
Requested Prescriptions  Pending Prescriptions Disp Refills   atorvastatin (LIPITOR) 20 MG tablet [Pharmacy Med Name: Atorvastatin Calcium 20 MG Oral Tablet] 90 tablet 3    Sig: TAKE 1 TABLET BY MOUTH ONCE  DAILY     Cardiovascular:  Antilipid - Statins Failed - 01/21/2023 10:07 PM      Failed - Lipid Panel in normal range within the last 12 months    Cholesterol, Total  Date Value Ref Range Status  11/21/2022 166 100 - 199 mg/dL Final   LDL Chol Calc (NIH)  Date Value Ref Range Status  11/21/2022 102 (H) 0 - 99 mg/dL Final   HDL  Date Value Ref Range Status  11/21/2022 42 >39 mg/dL Final   Triglycerides  Date Value Ref Range Status  11/21/2022 120 0 - 149 mg/dL Final         Passed - Patient is not pregnant      Passed - Valid encounter within last 12 months    Recent Outpatient Visits           2 months ago Annual physical exam   [facility] Malva Limes, MD   1 year ago Essential (primary) hypertension   [facility] Malva Limes, MD   2 years ago Annual physical exam   [facility] Malva Limes, MD   3 years ago Annual physical exam   [facility] Malva Limes, MD   5 years ago Chronic atrial fibrillation [facility])   [facility] Malva Limes, MD

## 2023-02-02 ENCOUNTER — Other Ambulatory Visit: Payer: Self-pay | Admitting: Family Medicine

## 2023-02-02 DIAGNOSIS — I10 Essential (primary) hypertension: Secondary | ICD-10-CM

## 2023-02-02 NOTE — Telephone Encounter (Signed)
Requested Prescriptions  Pending Prescriptions Disp Refills   valsartan-hydrochlorothiazide (DIOVAN-HCT) 160-12.5 MG tablet [Pharmacy Med Name: Valsartan-hydroCHLOROthiazide 160-12.5 MG Oral Tablet] 100 tablet 0    Sig: TAKE 1 TABLET BY MOUTH DAILY     Cardiovascular: ARB + Diuretic Combos Passed - 02/02/2023  4:34 AM      Passed - K in normal range and within 180 days    Potassium  Date Value Ref Range Status  11/21/2022 4.6 3.5 - 5.2 mmol/L Final         Passed - Na in normal range and within 180 days    Sodium  Date Value Ref Range Status  11/21/2022 143 134 - 144 mmol/L Final         Passed - Cr in normal range and within 180 days    Creatinine, Ser  Date Value Ref Range Status  11/21/2022 1.15 0.76 - 1.27 mg/dL Final         Passed - eGFR is 10 or above and within 180 days    GFR calc Af Amer  Date Value Ref Range Status  06/30/2020 79 >59 mL/min/1.73 Final    Comment:    **In accordance with recommendations from the NKF-ASN Task force,**   [facility] is in the process of updating its eGFR calculation to the   2021 CKD-EPI creatinine equation that estimates kidney function   without a race variable.    GFR calc non Af Amer  Date Value Ref Range Status  06/30/2020 69 >59 mL/min/1.73 Final   eGFR  Date Value Ref Range Status  11/21/2022 67 >59 mL/min/1.73 Final         Passed - Patient is not pregnant      Passed - Last BP in normal range    BP Readings from Last 1 Encounters:  11/21/22 113/71         Passed - Valid encounter within last 6 months    Recent Outpatient Visits           2 months ago Annual physical exam   [facility] Malva Limes, MD   1 year ago Essential (primary) hypertension   [facility] Malva Limes, MD   2 years ago Annual physical exam   [facility] Malva Limes, MD   3 years ago Annual physical exam   [facility]  Malva Limes, MD   5 years ago Chronic atrial fibrillation [facility])   [facility] Malva Limes, MD

## 2023-02-08 DIAGNOSIS — M25551 Pain in right hip: Secondary | ICD-10-CM | POA: Diagnosis not present

## 2023-02-28 DIAGNOSIS — I340 Nonrheumatic mitral (valve) insufficiency: Secondary | ICD-10-CM | POA: Diagnosis not present

## 2023-02-28 DIAGNOSIS — I480 Paroxysmal atrial fibrillation: Secondary | ICD-10-CM | POA: Diagnosis not present

## 2023-02-28 DIAGNOSIS — I10 Essential (primary) hypertension: Secondary | ICD-10-CM | POA: Diagnosis not present

## 2023-02-28 DIAGNOSIS — E782 Mixed hyperlipidemia: Secondary | ICD-10-CM | POA: Diagnosis not present

## 2023-03-19 DIAGNOSIS — C44212 Basal cell carcinoma of skin of right ear and external auricular canal: Secondary | ICD-10-CM | POA: Diagnosis not present

## 2023-03-19 DIAGNOSIS — C4491 Basal cell carcinoma of skin, unspecified: Secondary | ICD-10-CM | POA: Diagnosis not present

## 2023-04-04 DIAGNOSIS — C4491 Basal cell carcinoma of skin, unspecified: Secondary | ICD-10-CM | POA: Diagnosis not present

## 2023-04-04 DIAGNOSIS — C4441 Basal cell carcinoma of skin of scalp and neck: Secondary | ICD-10-CM | POA: Diagnosis not present

## 2023-04-09 ENCOUNTER — Encounter: Payer: Self-pay | Admitting: Family Medicine

## 2023-04-14 ENCOUNTER — Other Ambulatory Visit: Payer: Self-pay | Admitting: Family Medicine

## 2023-04-14 DIAGNOSIS — I10 Essential (primary) hypertension: Secondary | ICD-10-CM

## 2023-04-16 NOTE — Telephone Encounter (Signed)
Requested Prescriptions  Pending Prescriptions Disp Refills   valsartan-hydrochlorothiazide (DIOVAN-HCT) 160-12.5 MG tablet [Pharmacy Med Name: Valsartan-hydroCHLOROthiazide 160-12.5 MG Oral Tablet] 100 tablet 0    Sig: TAKE 1 TABLET BY MOUTH DAILY     Cardiovascular: ARB + Diuretic Combos Passed - 04/14/2023  4:32 AM      Passed - K in normal range and within 180 days    Potassium  Date Value Ref Range Status  11/21/2022 4.6 3.5 - 5.2 mmol/L Final         Passed - Na in normal range and within 180 days    Sodium  Date Value Ref Range Status  11/21/2022 143 134 - 144 mmol/L Final         Passed - Cr in normal range and within 180 days    Creatinine, Ser  Date Value Ref Range Status  11/21/2022 1.15 0.76 - 1.27 mg/dL Final         Passed - eGFR is 10 or above and within 180 days    GFR calc Af Amer  Date Value Ref Range Status  06/30/2020 79 >59 mL/min/1.73 Final    Comment:    **In accordance with recommendations from the NKF-ASN Task force,**   [facility] is in the process of updating its eGFR calculation to the   2021 CKD-EPI creatinine equation that estimates kidney function   without a race variable.    GFR calc non Af Amer  Date Value Ref Range Status  06/30/2020 69 >59 mL/min/1.73 Final   eGFR  Date Value Ref Range Status  11/21/2022 67 >59 mL/min/1.73 Final         Passed - Patient is not pregnant      Passed - Last BP in normal range    BP Readings from Last 1 Encounters:  11/21/22 113/71         Passed - Valid encounter within last 6 months    Recent Outpatient Visits           4 months ago Annual physical exam   [facility] Malva Limes, MD   1 year ago Essential (primary) hypertension   [facility] Malva Limes, MD   2 years ago Annual physical exam   [facility] Malva Limes, MD   3 years ago Annual physical exam   [facility]  Malva Limes, MD   5 years ago Chronic atrial fibrillation [facility])   [facility] Malva Limes, MD

## 2023-04-30 DIAGNOSIS — C44212 Basal cell carcinoma of skin of right ear and external auricular canal: Secondary | ICD-10-CM | POA: Diagnosis not present

## 2023-05-02 DIAGNOSIS — M1611 Unilateral primary osteoarthritis, right hip: Secondary | ICD-10-CM | POA: Diagnosis not present

## 2023-05-02 DIAGNOSIS — M47816 Spondylosis without myelopathy or radiculopathy, lumbar region: Secondary | ICD-10-CM | POA: Diagnosis not present

## 2023-05-14 DIAGNOSIS — C44212 Basal cell carcinoma of skin of right ear and external auricular canal: Secondary | ICD-10-CM | POA: Diagnosis not present

## 2023-05-15 ENCOUNTER — Encounter: Payer: Self-pay | Admitting: Family Medicine

## 2023-05-15 ENCOUNTER — Telehealth: Payer: Self-pay | Admitting: Family Medicine

## 2023-05-15 DIAGNOSIS — M1611 Unilateral primary osteoarthritis, right hip: Secondary | ICD-10-CM | POA: Diagnosis not present

## 2023-05-15 NOTE — Telephone Encounter (Signed)
Surgical clearance form being sent back from [facility] orth[facility].

## 2023-05-17 DIAGNOSIS — M25551 Pain in right hip: Secondary | ICD-10-CM | POA: Diagnosis not present

## 2023-05-17 DIAGNOSIS — R262 Difficulty in walking, not elsewhere classified: Secondary | ICD-10-CM | POA: Diagnosis not present

## 2023-05-17 NOTE — Telephone Encounter (Signed)
Pt called to confirm forms were received. Informed pt of Dr. Theodis Aguas note that forms were signed and sent to medical records. Pt verbalized understanding.

## 2023-05-17 NOTE — Telephone Encounter (Signed)
Was signed and send to medical records yesterday.

## 2023-05-18 DIAGNOSIS — E119 Type 2 diabetes mellitus without complications: Secondary | ICD-10-CM | POA: Diagnosis not present

## 2023-05-18 DIAGNOSIS — M25551 Pain in right hip: Secondary | ICD-10-CM | POA: Diagnosis not present

## 2023-05-18 DIAGNOSIS — M1611 Unilateral primary osteoarthritis, right hip: Secondary | ICD-10-CM | POA: Diagnosis not present

## 2023-06-12 DIAGNOSIS — M1611 Unilateral primary osteoarthritis, right hip: Secondary | ICD-10-CM | POA: Diagnosis not present

## 2023-06-20 ENCOUNTER — Telehealth: Payer: Self-pay

## 2023-06-20 NOTE — Telephone Encounter (Signed)
 No, I would not put him medication for an A1c of 6.6.

## 2023-06-20 NOTE — Telephone Encounter (Signed)
 LV for Helmut Muster with [facility] and to let us know on how we can help or if something is needed.

## 2023-06-20 NOTE — Telephone Encounter (Signed)
 Copied from CRM [id number]. Topic: General - Other >> Jun 19, 2023  5:37 PM Everette C wrote: Reason for CRM: Prov. Alicia C. With [facility] has called to request contact with a member of clinical staff regarding the patient at [phone]   [facility] Bernarda has a question regarding clearance and the patient's recent diagnosis of diabetes   Please contact when possible

## 2023-06-20 NOTE — Telephone Encounter (Signed)
 Ok for [facility] Nurse to get information.

## 2023-06-20 NOTE — Telephone Encounter (Signed)
 Spoke with Bernarda PA with [facility]. Reports that they received the clearance form and and they see that patient's Last A1C was 6.6 on 06/011/2024 and optimized it, current A1C of 5.8% Bernarda said they checked patient's A1c a week ago and it is at 6.6 % which indicated diabetes,which increases risk of infection and wants to know if Dr.Fisher going to put patient on medication and to let the office note in order to proceed. Office number is [phone]

## 2023-06-22 NOTE — Telephone Encounter (Signed)
 Called number provided and per scheduling, no nurses and would have [place] call me back. Schedulers transferred me to surgery scheduling No answer.

## 2023-06-25 ENCOUNTER — Ambulatory Visit (INDEPENDENT_AMBULATORY_CARE_PROVIDER_SITE_OTHER): Payer: Medicare Other | Admitting: Family Medicine

## 2023-06-25 VITALS — BP 128/74 | HR 78 | Ht 69.0 in | Wt 190.8 lb

## 2023-06-25 DIAGNOSIS — J029 Acute pharyngitis, unspecified: Secondary | ICD-10-CM

## 2023-06-25 DIAGNOSIS — R059 Cough, unspecified: Secondary | ICD-10-CM

## 2023-06-25 LAB — POC COVID19/FLU A&B COMBO
Covid Antigen, POC: NEGATIVE
Influenza A Antigen, POC: NEGATIVE
Influenza B Antigen, POC: NEGATIVE

## 2023-06-25 NOTE — Progress Notes (Signed)
 Established patient visit   Patient: Mark Pierce   DOB: [birth year]   73 y.o. Male  MRN: [id number] Visit Date: 06/25/2023  Today's healthcare provider: Nancyann Perry, MD   Chief Complaint  Patient presents with   Sore Throat    Started on Saturday, no fever, Feels a lot better today, but needs testing prior to surgery    Nasal Congestion   Subjective    Discussed the use of AI scribe software for clinical note transcription with the patient, who gave verbal consent to proceed.  History of Present Illness   Mark Pierce, who is scheduled for a hip replacement surgery, presented with concerns about recent URI symptoms. The onset of symptoms was two days ago, following a holiday visit to [place] where family members were ill. The patient's symptoms include a runny nose, watery eyes, and a sore throat, which was particularly severe the previous night but has since improved. The patient has not had a fever but did report coughing up phlegm from the chest this morning. Has had no cough since and otherwise feels better today. The patient's wife has also been ill for the past week. The patient sought consultation to ensure he does not have any contagious diseases that could impact his upcoming surgery.       Medications: Outpatient Medications Prior to Visit  Medication Sig   apixaban (ELIQUIS) 5 MG TABS tablet Take 1 tablet by mouth 2 (two) times daily.   atorvastatin  (LIPITOR) 20 MG tablet TAKE 1 TABLET BY MOUTH ONCE  DAILY   diltiazem (CARDIZEM CD) 120 MG 24 hr capsule Take 120 mg by mouth daily.   loratadine (CLARITIN) 10 MG tablet Take 10 mg by mouth daily.   Multiple Vitamins-Minerals (MULTIVITAMIN ADULT PO) Take 1 tablet by mouth daily.   sildenafil  (REVATIO ) 20 MG tablet 3-5 tablets as needed prior to intercourse, not to exceed 5 tablets in a day   valsartan -hydrochlorothiazide  (DIOVAN -HCT) 160-12.5 MG tablet TAKE 1 TABLET BY MOUTH DAILY   No facility-administered  medications prior to visit.   Review of Systems     Objective    BP 128/74   Pulse 78   Ht 5' 9 (1.753 m)   Wt 190 lb 12.8 oz (86.5 kg)   BMI 28.18 kg/m   Physical Exam   General Appearance:    Well developed, well nourished male, alert, cooperative, in no acute distress  HENT:   sinuses nontender and nasal mucosa pale and congested  Eyes:    PERRL, conjunctiva/corneas clear, EOM's intact       Lungs:     Clear to auscultation bilaterally, respirations unlabored  Heart:    Normal heart rate. Irregularly irregular rhythm. No murmurs, rubs, or gallops.    Neurologic:   Awake, alert, oriented x 3. No apparent focal neurological           defect.         Results for orders placed or performed in visit on 06/25/23  POC Covid19/Flu A&B Antigen  Result Value Ref Range   Influenza A Antigen, POC Negative Negative   Influenza B Antigen, POC Negative Negative   Covid Antigen, POC Negative Negative    Assessment & Plan        Upper Respiratory Infection Symptoms of runny nose, watery eyes, and sore throat since Saturday. No fever. Recent exposure to family members with similar symptoms. Negative for COVID and flu. -Conservative management with rest and hydration.  Preoperative Evaluation  Scheduled for hip replacement surgery on Friday. No signs of active infection that would contraindicate surgery. -No medical indication for chest xray at this time.  Consider xray if cough returns or if deemed necessary by surgery or anesthesia team. Order placed to [facility] in cased it is needed.         Nancyann Perry, MD  [facility] [phone] (phone) [phone] (fax)  [facility]

## 2023-06-25 NOTE — Patient Instructions (Addendum)
 Please review the attached list of medications and notify my office if there are any errors.   If you need a chest done then go to [facility]) Imaging at [facility] for your Xrays (phone no. [phone])

## 2023-06-26 NOTE — Telephone Encounter (Signed)
 Left message for Helmut Muster PA with Nurse

## 2023-07-06 DIAGNOSIS — M1611 Unilateral primary osteoarthritis, right hip: Secondary | ICD-10-CM | POA: Diagnosis not present

## 2023-07-10 DIAGNOSIS — Z96641 Presence of right artificial hip joint: Secondary | ICD-10-CM | POA: Diagnosis not present

## 2023-07-10 DIAGNOSIS — M25551 Pain in right hip: Secondary | ICD-10-CM | POA: Diagnosis not present

## 2023-07-10 DIAGNOSIS — R2689 Other abnormalities of gait and mobility: Secondary | ICD-10-CM | POA: Diagnosis not present

## 2023-07-12 DIAGNOSIS — R2689 Other abnormalities of gait and mobility: Secondary | ICD-10-CM | POA: Diagnosis not present

## 2023-07-12 DIAGNOSIS — Z96641 Presence of right artificial hip joint: Secondary | ICD-10-CM | POA: Diagnosis not present

## 2023-07-12 DIAGNOSIS — M25551 Pain in right hip: Secondary | ICD-10-CM | POA: Diagnosis not present

## 2023-07-17 DIAGNOSIS — M25551 Pain in right hip: Secondary | ICD-10-CM | POA: Diagnosis not present

## 2023-07-17 DIAGNOSIS — R2689 Other abnormalities of gait and mobility: Secondary | ICD-10-CM | POA: Diagnosis not present

## 2023-07-17 DIAGNOSIS — Z96641 Presence of right artificial hip joint: Secondary | ICD-10-CM | POA: Diagnosis not present

## 2023-07-19 DIAGNOSIS — M1611 Unilateral primary osteoarthritis, right hip: Secondary | ICD-10-CM | POA: Diagnosis not present

## 2023-07-20 DIAGNOSIS — Z96641 Presence of right artificial hip joint: Secondary | ICD-10-CM | POA: Diagnosis not present

## 2023-07-20 DIAGNOSIS — R2689 Other abnormalities of gait and mobility: Secondary | ICD-10-CM | POA: Diagnosis not present

## 2023-07-20 DIAGNOSIS — M25551 Pain in right hip: Secondary | ICD-10-CM | POA: Diagnosis not present

## 2023-07-24 DIAGNOSIS — R2689 Other abnormalities of gait and mobility: Secondary | ICD-10-CM | POA: Diagnosis not present

## 2023-07-24 DIAGNOSIS — M25551 Pain in right hip: Secondary | ICD-10-CM | POA: Diagnosis not present

## 2023-07-24 DIAGNOSIS — Z96641 Presence of right artificial hip joint: Secondary | ICD-10-CM | POA: Diagnosis not present

## 2023-07-27 DIAGNOSIS — Z96641 Presence of right artificial hip joint: Secondary | ICD-10-CM | POA: Diagnosis not present

## 2023-07-27 DIAGNOSIS — R2689 Other abnormalities of gait and mobility: Secondary | ICD-10-CM | POA: Diagnosis not present

## 2023-07-27 DIAGNOSIS — M25551 Pain in right hip: Secondary | ICD-10-CM | POA: Diagnosis not present

## 2023-07-31 DIAGNOSIS — R2689 Other abnormalities of gait and mobility: Secondary | ICD-10-CM | POA: Diagnosis not present

## 2023-07-31 DIAGNOSIS — M25551 Pain in right hip: Secondary | ICD-10-CM | POA: Diagnosis not present

## 2023-07-31 DIAGNOSIS — Z96641 Presence of right artificial hip joint: Secondary | ICD-10-CM | POA: Diagnosis not present

## 2023-08-02 DIAGNOSIS — M25551 Pain in right hip: Secondary | ICD-10-CM | POA: Diagnosis not present

## 2023-08-02 DIAGNOSIS — Z96641 Presence of right artificial hip joint: Secondary | ICD-10-CM | POA: Diagnosis not present

## 2023-08-02 DIAGNOSIS — R2689 Other abnormalities of gait and mobility: Secondary | ICD-10-CM | POA: Diagnosis not present

## 2023-08-07 DIAGNOSIS — M25551 Pain in right hip: Secondary | ICD-10-CM | POA: Diagnosis not present

## 2023-08-07 DIAGNOSIS — Z96641 Presence of right artificial hip joint: Secondary | ICD-10-CM | POA: Diagnosis not present

## 2023-08-07 DIAGNOSIS — R2689 Other abnormalities of gait and mobility: Secondary | ICD-10-CM | POA: Diagnosis not present

## 2023-08-09 DIAGNOSIS — M25551 Pain in right hip: Secondary | ICD-10-CM | POA: Diagnosis not present

## 2023-08-09 DIAGNOSIS — R2689 Other abnormalities of gait and mobility: Secondary | ICD-10-CM | POA: Diagnosis not present

## 2023-08-09 DIAGNOSIS — Z96641 Presence of right artificial hip joint: Secondary | ICD-10-CM | POA: Diagnosis not present

## 2023-08-10 DIAGNOSIS — I480 Paroxysmal atrial fibrillation: Secondary | ICD-10-CM | POA: Diagnosis not present

## 2023-08-21 DIAGNOSIS — R2689 Other abnormalities of gait and mobility: Secondary | ICD-10-CM | POA: Diagnosis not present

## 2023-08-21 DIAGNOSIS — M25551 Pain in right hip: Secondary | ICD-10-CM | POA: Diagnosis not present

## 2023-08-21 DIAGNOSIS — Z96641 Presence of right artificial hip joint: Secondary | ICD-10-CM | POA: Diagnosis not present

## 2023-08-23 DIAGNOSIS — M1611 Unilateral primary osteoarthritis, right hip: Secondary | ICD-10-CM | POA: Diagnosis not present

## 2023-09-19 ENCOUNTER — Ambulatory Visit: Payer: Self-pay

## 2023-09-19 DIAGNOSIS — Z0000 Encounter for general adult medical examination without abnormal findings: Secondary | ICD-10-CM

## 2023-09-19 NOTE — Patient Instructions (Addendum)
 Mark Pierce , Thank you for taking time to come for your Medicare Wellness Visit. I appreciate your ongoing commitment to your health goals. Please review the following plan we discussed and let me know if I can assist you in the future.   Referrals/Orders/Follow-Ups/Clinician Recommendations: WILL NEED PNEUMONIA SHOT IN THE FALL  This is a list of the screening recommended for you and due dates:  Health Maintenance  Topic Date Due   DTaP/Tdap/Td vaccine (2 - Tdap) 06/01/2013   COVID-19 Vaccine (4 - 2024-25 season) 02/11/2023   Flu Shot  01/11/2024   Medicare Annual Wellness Visit  09/18/2024   Colon Cancer Screening  07/20/2031   Pneumonia Vaccine  Completed   Hepatitis C Screening  Completed   Zoster (Shingles) Vaccine  Completed   HPV Vaccine  Aged Out    Advanced directives: (ACP Link)Information on Advanced Care Planning can be found at [facility] Advance Health Care Directives Advance Health Care Directives. [email or web address]   Next Medicare Annual Wellness Visit scheduled for next year: Yes   09/23/24 @ 2:30 PM BY PHONE

## 2023-09-19 NOTE — Progress Notes (Signed)
 Subjective:   Mark Pierce is a 73 y.o. who presents for a Medicare Wellness preventive visit.  Visit Complete: Virtual I connected with  Almyra Free on 09/19/23 by a audio enabled telemedicine application and verified that I am speaking with the correct person using two identifiers.  Patient Location: Home  Provider Location: Office/Clinic  I discussed the limitations of evaluation and management by telemedicine. The patient expressed understanding and agreed to proceed.  Vital Signs: Because this visit was a virtual/telehealth visit, some criteria may be missing or patient reported. Any vitals not documented were not able to be obtained and vitals that have been documented are patient reported.  VideoDeclined- This patient declined Librarian, academic. Therefore the visit was completed with audio only.  Persons Participating in Visit: Patient.  AWV Questionnaire: No: Patient Medicare AWV questionnaire was not completed prior to this visit.  Cardiac Risk Factors include: advanced age (>90men, >68 women);hypertension;male gender;dyslipidemia     Objective:    There were no vitals filed for this visit. There is no height or weight on file to calculate BMI.     09/19/2023    8:18 AM 09/18/2022    9:26 AM 08/15/2021    9:45 AM 07/19/2021    9:45 AM 01/08/2020    2:17 PM 01/07/2019    3:12 PM 09/06/2017    8:52 AM  Advanced Directives  Does Patient Have a Medical Advance Directive? No Yes No No Yes Yes Yes  Type of Agricultural consultant;Living will Healthcare Power of [place];Living will Healthcare Power of [place];Living will  Copy of Healthcare Power of Attorney in Chart?     No - copy requested No - copy requested No - copy requested  Would patient like information on creating a medical advance directive? No - Patient declined  No - Patient declined        Current Medications (verified) Outpatient Encounter  Medications as of 09/19/2023  Medication Sig   apixaban (ELIQUIS) 5 MG TABS tablet Take 1 tablet by mouth 2 (two) times daily.   atorvastatin (LIPITOR) 20 MG tablet TAKE 1 TABLET BY MOUTH ONCE  DAILY   diltiazem (CARDIZEM CD) 120 MG 24 hr capsule Take 120 mg by mouth daily.   loratadine (CLARITIN) 10 MG tablet Take 10 mg by mouth daily.   Multiple Vitamins-Minerals (MULTIVITAMIN ADULT PO) Take 1 tablet by mouth daily.   sildenafil (REVATIO) 20 MG tablet 3-5 tablets as needed prior to intercourse, not to exceed 5 tablets in a day   valsartan-hydrochlorothiazide (DIOVAN-HCT) 160-12.5 MG tablet TAKE 1 TABLET BY MOUTH DAILY   No facility-administered encounter medications on file as of 09/19/2023.    Allergies (verified) Lisinopril   History: Past Medical History:  Diagnosis Date   A-fib (HCC)    History of chicken pox    Hyperlipidemia    Hypertension    Past Surgical History:  Procedure Laterality Date   APPENDECTOMY  06/13/1979   COLONOSCOPY N/A 07/19/2021   Procedure: COLONOSCOPY;  Surgeon: Regis Bill, MD;  Location: [facility] ENDOSCOPY;  Service: Endoscopy;  Laterality: N/A;   HERNIA REPAIR     SPINAL FUSION  06/12/2006   patient stated that he had a cervical spine infusion    WISDOM TOOTH EXTRACTION     Family History  Problem Relation Age of Onset   CAD Father    Colitis Father    Heart attack Father    Hyperlipidemia Brother  Mitral valve prolapse Brother    Heart attack Paternal Grandmother    Social History   Socioeconomic History   Marital status: Married    Spouse name: Not on file   Number of children: 2   Years of education: Not on file   Highest education level: Bachelor's degree (e.g., BA, AB, BS)  Occupational History   Occupation: Retired    Comment: 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 Retired in 2015  Tobacco Use   Smoking status: Former    Current packs/day: 0.00    Types: Cigarettes    Quit date: 06/12/1998    Years since quitting: 25.2    Smokeless tobacco: Never  Vaping Use   Vaping status: Never Used  Substance and Sexual Activity   Alcohol use: Yes    Alcohol/week: 21.0 standard drinks of alcohol    Types: 7 Glasses of wine, 4 Cans of beer, 10 Shots of liquor per week    Comment: moderate use   Drug use: Yes    Types: Marijuana   Sexual activity: Yes  Other Topics Concern   Not on file  Social History Narrative   Not on file   Social Drivers of Health   Financial Resource Strain: Low Risk  (09/19/2023)   Overall Financial Resource Strain (CARDIA)    Difficulty of Paying Living Expenses: Not hard at all  Food Insecurity: No Food Insecurity (09/19/2023)   Hunger Vital Sign    Worried About Running Out of Food in the Last Year: Never true    Ran Out of Food in the Last Year: Never true  Transportation Needs: No Transportation Needs (09/19/2023)   PRAPARE - Administrator, Civil Service (Medical): No    Lack of Transportation (Non-Medical): No  Physical Activity: Sufficiently Active (09/19/2023)   Exercise Vital Sign    Days of Exercise per Week: 4 days    Minutes of Exercise per Session: 60 min  Stress: No Stress Concern Present (09/18/2022)   [employer] of Occupational Health - Occupational Stress Questionnaire    Feeling of Stress : Not at all  Social Connections: Moderately Integrated (09/19/2023)   Social Connection and Isolation Panel [NHANES]    Frequency of Communication with Friends and Family: More than three times a week    Frequency of Social Gatherings with Friends and Family: Twice a week    Attends Religious Services: Never    Database administrator or Organizations: Yes    Attends Engineer, structural: More than 4 times per year    Marital Status: Married    Tobacco Counseling Counseling given: Not Answered    Clinical Intake:  Pre-visit preparation completed: Yes  Pain : No/denies pain     BMI - recorded: 28.1 Nutritional Status: BMI 25 -29  Overweight Nutritional Risks: None Diabetes: No  Lab Results  Component Value Date   HGBA1C 6.6 (H) 11/21/2022   HGBA1C 6.1 (A) 10/25/2021   HGBA1C 6.4 (H) 06/30/2020     How often do you need to have someone help you when you read instructions, pamphlets, or other written materials from your doctor or pharmacy?: 1 - Never  Interpreter Needed?: No  Information entered by :: Kennedy Bucker, LPN   Activities of Daily Living     09/19/2023    8:19 AM 11/21/2022    8:07 AM  In your present state of health, do you have any difficulty performing the following activities:  Hearing? 1 1  Vision? 0 0  Difficulty concentrating or making decisions? 0 0  Walking or climbing stairs? 0 0  Dressing or bathing? 0 0  Doing errands, shopping? 0 0  Preparing Food and eating ? N   Using the Toilet? N   In the past six months, have you accidently leaked urine? N   Do you have problems with loss of bowel control? N   Managing your Medications? N   Managing your Finances? N   Housekeeping or managing your Housekeeping? N     Patient Care Team: Malva Limes, MD as PCP - General (Family Medicine) Lady Gary Darlin Priestly, MD as Consulting Physician (Cardiology) Jesusita Oka, MD (Dermatology) Pa, [facility] Od Cleophas Dunker, Octaviano Batty, MD as Consulting Physician (Sports Medicine)  Indicate any recent Medical Services you may have received from other than [facility] providers in the past year (date may be approximate).     Assessment:   This is a routine wellness examination for Mark Pierce.  Hearing/Vision screen Hearing Screening - Comments:: WEARS AIDS, BOTH EARS Vision Screening - Comments:: WEARS GLASSES ALL THE TIME- PATTY VISION   Goals Addressed             This Visit's Progress    Cut out extra servings         Depression Screen     09/19/2023    8:16 AM 06/25/2023    4:41 PM 11/21/2022    8:07 AM 09/18/2022    9:20 AM 08/15/2021    9:44 AM 06/29/2020    3:23 PM 01/08/2020    2:13  PM  PHQ 2/9 Scores  PHQ - 2 Score 0 0 0 0 0 0 0  PHQ- 9 Score 0 0 0   0     Fall Risk     09/19/2023    8:19 AM 06/25/2023    4:42 PM 11/21/2022    8:07 AM 09/18/2022    9:17 AM 08/15/2021    9:46 AM  Fall Risk   Falls in the past year? 0 0 0 0 0  Number falls in past yr: 0 0 0 0 0  Injury with Fall? 0 0 0 0 0  Risk for fall due to : No Fall Risks  No Fall Risks No Fall Risks No Fall Risks  Follow up Falls prevention discussed;Falls evaluation completed  Falls evaluation completed Education provided;Falls prevention discussed Falls evaluation completed    MEDICARE RISK AT HOME:  Medicare Risk at Home Any stairs in or around the home?: Yes If so, are there any without handrails?: No Home free of loose throw rugs in walkways, pet beds, electrical cords, etc?: Yes Adequate lighting in your home to reduce risk of falls?: Yes Life alert?: No Use of a cane, walker or w/c?: No Grab bars in the bathroom?: No Shower chair or bench in shower?: No Elevated toilet seat or a handicapped toilet?: Yes  TIMED UP AND GO:  Was the test performed?  No  Cognitive Function: 6CIT completed        09/19/2023    8:21 AM 09/18/2022    9:28 AM 01/07/2019    3:21 PM 08/24/2016    9:22 AM  6CIT Screen  What Year? 0 points 0 points 0 points 0 points  What month? 0 points 0 points 0 points 0 points  What time? 0 points 0 points 0 points 0 points  Count back from 20 0 points 0 points 0 points 0 points  Months in reverse 0 points 0 points  0 points 0 points  Repeat phrase 0 points 0 points 0 points 0 points  Total Score 0 points 0 points 0 points 0 points    Immunizations Immunization History  Administered Date(s) Administered   Fluad Quad(high Dose 65+) 06/14/2022   H1N1 04/27/2008   Influenza, High Dose Seasonal PF 03/11/2017, 03/21/2019, 04/26/2020   Influenza,inj,quad, With Preservative 03/26/2021   Influenza-Unspecified 04/13/2015, 03/26/2018, 04/13/2023   Moderna Sars-Covid-2 Vaccination  07/18/2019, 08/15/2019, 04/06/2020   Pneumococcal Conjugate-13 10/06/2013   Pneumococcal Polysaccharide-23 04/28/2015   Td 06/02/2003   Zoster Recombinant(Shingrix) 01/24/2021, 05/09/2021   Zoster, Live 03/08/2013    Screening Tests Health Maintenance  Topic Date Due   DTaP/Tdap/Td (2 - Tdap) 06/01/2013   COVID-19 Vaccine (4 - 2024-25 season) 02/11/2023   INFLUENZA VACCINE  01/11/2024   Medicare Annual Wellness (AWV)  09/18/2024   Colonoscopy  07/20/2031   Pneumonia Vaccine 71+ Years old  Completed   Hepatitis C Screening  Completed   Zoster Vaccines- Shingrix  Completed   HPV VACCINES  Aged Out    Health Maintenance  Health Maintenance Due  Topic Date Due   DTaP/Tdap/Td (2 - Tdap) 06/01/2013   COVID-19 Vaccine (4 - 2024-25 season) 02/11/2023   Health Maintenance Items Addressed: UP TO DATE ON COLONOSCOPY, UP TO DATE ON SHOTS EXCEPT PNA   Additional Screening:  Vision Screening: Recommended annual ophthalmology exams for early detection of glaucoma and other disorders of the eye.  Dental Screening: Recommended annual dental exams for proper oral hygiene  Community Resource Referral / Chronic Care Management: CRR required this visit?  No   CCM required this visit?  No     Plan:     I have personally reviewed and noted the following in the patient's chart:   Medical and social history Use of alcohol, tobacco or illicit drugs  Current medications and supplements including opioid prescriptions. Patient is not currently taking opioid prescriptions. Functional ability and status Nutritional status Physical activity Advanced directives List of other physicians Hospitalizations, surgeries, and ER visits in previous 12 months Vitals Screenings to include cognitive, depression, and falls Referrals and appointments  In addition, I have reviewed and discussed with patient certain preventive protocols, quality metrics, and best practice recommendations. A written  personalized care plan for preventive services as well as general preventive health recommendations were provided to patient.     Hal Hope, LPN   02/15/6212   After Visit Summary: (MyChart) Due to this being a telephonic visit, the after visit summary with patients personalized plan was offered to patient via MyChart   Notes: Nothing significant to report at this time.

## 2023-09-29 ENCOUNTER — Other Ambulatory Visit: Payer: Self-pay | Admitting: Family Medicine

## 2023-09-29 DIAGNOSIS — I10 Essential (primary) hypertension: Secondary | ICD-10-CM

## 2023-09-30 ENCOUNTER — Encounter: Payer: Self-pay | Admitting: Family Medicine

## 2023-10-01 ENCOUNTER — Other Ambulatory Visit: Payer: Self-pay

## 2023-10-01 DIAGNOSIS — I10 Essential (primary) hypertension: Secondary | ICD-10-CM

## 2023-10-01 MED ORDER — VALSARTAN-HYDROCHLOROTHIAZIDE 160-12.5 MG PO TABS: 1.0000 | ORAL_TABLET | Freq: Every day | ORAL | 0 refills | Status: DC

## 2023-10-02 ENCOUNTER — Other Ambulatory Visit: Payer: Self-pay | Admitting: Family Medicine

## 2023-10-02 DIAGNOSIS — I10 Essential (primary) hypertension: Secondary | ICD-10-CM

## 2023-10-03 NOTE — Telephone Encounter (Signed)
 Labs in date.     Requested Prescriptions  Pending Prescriptions Disp Refills   atorvastatin  (LIPITOR) 20 MG tablet [Pharmacy Med Name: Atorvastatin  Calcium  20 MG Oral Tablet] 100 tablet 0    Sig: TAKE 1 TABLET BY MOUTH ONCE  DAILY     Cardiovascular:  Antilipid - Statins Failed - 10/03/2023  2:09 PM      Failed - Lipid Panel in normal range within the last 12 months    Cholesterol, Total  Date Value Ref Range Status  11/21/2022 166 100 - 199 mg/dL Final   LDL Chol Calc (NIH)  Date Value Ref Range Status  11/21/2022 102 (H) 0 - 99 mg/dL Final   HDL  Date Value Ref Range Status  11/21/2022 42 >39 mg/dL Final   Triglycerides  Date Value Ref Range Status  11/21/2022 120 0 - 149 mg/dL Final         Passed - Patient is not pregnant      Passed - Valid encounter within last 12 months    Recent Outpatient Visits   None

## 2023-11-21 DIAGNOSIS — D2262 Melanocytic nevi of left upper limb, including shoulder: Secondary | ICD-10-CM | POA: Diagnosis not present

## 2023-11-21 DIAGNOSIS — Z85828 Personal history of other malignant neoplasm of skin: Secondary | ICD-10-CM | POA: Diagnosis not present

## 2023-11-21 DIAGNOSIS — D2261 Melanocytic nevi of right upper limb, including shoulder: Secondary | ICD-10-CM | POA: Diagnosis not present

## 2023-11-21 DIAGNOSIS — L821 Other seborrheic keratosis: Secondary | ICD-10-CM | POA: Diagnosis not present

## 2023-11-21 DIAGNOSIS — D225 Melanocytic nevi of trunk: Secondary | ICD-10-CM | POA: Diagnosis not present

## 2023-11-21 DIAGNOSIS — L570 Actinic keratosis: Secondary | ICD-10-CM | POA: Diagnosis not present

## 2023-12-11 ENCOUNTER — Telehealth: Payer: Self-pay | Admitting: Family Medicine

## 2023-12-11 DIAGNOSIS — I10 Essential (primary) hypertension: Secondary | ICD-10-CM

## 2023-12-12 NOTE — Progress Notes (Signed)
 error

## 2023-12-12 NOTE — Telephone Encounter (Signed)
Patient is overdue for follow up office visit and labs. Needs to schedule before refill can be approved.

## 2023-12-15 ENCOUNTER — Other Ambulatory Visit: Payer: Self-pay | Admitting: Family Medicine

## 2023-12-15 DIAGNOSIS — I10 Essential (primary) hypertension: Secondary | ICD-10-CM

## 2024-01-08 DIAGNOSIS — H6121 Impacted cerumen, right ear: Secondary | ICD-10-CM | POA: Diagnosis not present

## 2024-01-08 DIAGNOSIS — H6122 Impacted cerumen, left ear: Secondary | ICD-10-CM | POA: Diagnosis not present

## 2024-01-23 ENCOUNTER — Ambulatory Visit: Admitting: Family Medicine

## 2024-02-18 ENCOUNTER — Ambulatory Visit: Admitting: Family Medicine

## 2024-02-19 ENCOUNTER — Other Ambulatory Visit: Payer: Self-pay | Admitting: Family Medicine

## 2024-02-19 DIAGNOSIS — I10 Essential (primary) hypertension: Secondary | ICD-10-CM

## 2024-02-20 DIAGNOSIS — H25813 Combined forms of age-related cataract, bilateral: Secondary | ICD-10-CM | POA: Diagnosis not present

## 2024-02-22 ENCOUNTER — Other Ambulatory Visit: Payer: Self-pay | Admitting: Family Medicine

## 2024-02-22 DIAGNOSIS — I10 Essential (primary) hypertension: Secondary | ICD-10-CM

## 2024-03-05 ENCOUNTER — Other Ambulatory Visit: Payer: Self-pay

## 2024-03-05 ENCOUNTER — Encounter: Payer: Self-pay | Admitting: Family Medicine

## 2024-03-05 DIAGNOSIS — I10 Essential (primary) hypertension: Secondary | ICD-10-CM

## 2024-03-05 NOTE — Telephone Encounter (Signed)
 LOV 05/02/23 NOV 03/24/24 LRF 08/16/21 30 x 5

## 2024-03-06 ENCOUNTER — Telehealth: Payer: Self-pay

## 2024-03-06 MED ORDER — SILDENAFIL CITRATE 20 MG PO TABS: ORAL_TABLET | ORAL | 1 refills | Status: AC

## 2024-03-06 NOTE — Telephone Encounter (Unsigned)
 Copied from CRM [id number]. Topic: Clinical - Prescription Issue >> Mar 06, 2024  9:04 AM Avram MATSU wrote: Reason for CRM: Harlene stated the prescription was written wrong/indication was typed wrong sildenafil  (REVATIO ) 20 MG tablet [[id number]] 3-5 tablets as needed prior to intercourse but the amount is too much.  Please advise [phone] ask for anyone or leave a note on the rx.

## 2024-03-06 NOTE — Telephone Encounter (Signed)
 That come out to 60-100mg  which is the typical dose range for ED.

## 2024-03-07 NOTE — Telephone Encounter (Signed)
 Pharmacy reports issue resolved and patient has picked up rx

## 2024-03-24 ENCOUNTER — Ambulatory Visit (INDEPENDENT_AMBULATORY_CARE_PROVIDER_SITE_OTHER): Admitting: Family Medicine

## 2024-03-24 ENCOUNTER — Encounter: Payer: Self-pay | Admitting: Family Medicine

## 2024-03-24 VITALS — BP 133/81 | HR 99 | Ht 69.0 in | Wt 190.7 lb

## 2024-03-24 DIAGNOSIS — I10 Essential (primary) hypertension: Secondary | ICD-10-CM | POA: Diagnosis not present

## 2024-03-24 DIAGNOSIS — I4819 Other persistent atrial fibrillation: Secondary | ICD-10-CM

## 2024-03-24 DIAGNOSIS — E7800 Pure hypercholesterolemia, unspecified: Secondary | ICD-10-CM | POA: Diagnosis not present

## 2024-03-24 DIAGNOSIS — R7303 Prediabetes: Secondary | ICD-10-CM | POA: Diagnosis not present

## 2024-03-24 DIAGNOSIS — Z125 Encounter for screening for malignant neoplasm of prostate: Secondary | ICD-10-CM

## 2024-03-24 NOTE — Progress Notes (Signed)
 Established patient visit   Patient: Mark Pierce   DOB: 08-26-1950   73 y.o. Male  MRN: [id number] Visit Date: 03/24/2024  Today's healthcare provider: Nancyann Perry, MD   Chief Complaint  Patient presents with   Medical Management of Chronic Issues   Subjective    Discussed the use of AI scribe software for clinical note transcription with the patient, who gave verbal consent to proceed.  History of Present Illness   Mark Pierce is a 73 year old male who presents for a routine follow-up hypertension, hyperlipidemia, prediabetes and atrial fibrillation.  He feels well overall and remains active, participating in fly fishing, golf, and exercise classes twice a week. He reports inconsistency with daily walks.  He has a history of atrial fibrillation and is under the care of a cardiologist and an electrophysiologist. He is unaware of any ongoing issues related to atrial fibrillation. No trouble with breathing, shortness of breath, or swelling in his hands, feet, or ankles.  He underwent a hip replacement on July 06, 2023, which was without complications.  He maintains a diet low in sweets and prepared foods, preferring to cook at home. He occasionally consumes honey but generally avoids sugary foods.  He has not received a flu shot this year.     Lab Results  Component Value Date   CHOL 166 11/21/2022   HDL 42 11/21/2022   LDLCALC 102 (H) 11/21/2022   TRIG 120 11/21/2022   CHOLHDL 4.0 11/21/2022   Lab Results  Component Value Date   NA 143 11/21/2022   K 4.6 11/21/2022   CREATININE 1.15 11/21/2022   EGFR 67 11/21/2022   GLUCOSE 140 (H) 11/21/2022   Lab Results  Component Value Date   HGBA1C 6.6 (H) 11/21/2022   HGBA1C 6.1 (A) 10/25/2021   HGBA1C 6.4 (H) 06/30/2020     Medications: Outpatient Medications Prior to Visit  Medication Sig   apixaban (ELIQUIS) 5 MG TABS tablet Take 1 tablet by mouth 2 (two) times daily.   atorvastatin  (LIPITOR) 20 MG  tablet TAKE 1 TABLET BY MOUTH ONCE  DAILY   diltiazem (CARDIZEM CD) 120 MG 24 hr capsule Take 120 mg by mouth daily.   loratadine (CLARITIN) 10 MG tablet Take 10 mg by mouth daily.   Multiple Vitamins-Minerals (MULTIVITAMIN ADULT PO) Take 1 tablet by mouth daily.   sildenafil  (REVATIO ) 20 MG tablet 3-5 tablets as needed prior to intercourse, not to exceed 5 tablets in a day   valsartan -hydrochlorothiazide  (DIOVAN -HCT) 160-12.5 MG tablet TAKE 1 TABLET BY MOUTH DAILY   No facility-administered medications prior to visit.   Review of Systems  Constitutional:  Negative for appetite change, chills and fever.  Respiratory:  Negative for chest tightness, shortness of breath and wheezing.   Cardiovascular:  Negative for chest pain and palpitations.  Gastrointestinal:  Negative for abdominal pain, nausea and vomiting.       Objective    BP 133/81 (BP Location: Left Arm, Patient Position: Sitting, Cuff Size: Normal)   Pulse 99   Ht 5' 9 (1.753 m)   Wt 190 lb 11.2 oz (86.5 kg)   SpO2 97%   BMI 28.16 kg/m   Physical Exam   General: Appearance:    Well developed, well nourished male in no acute distress  Eyes:    PERRL, conjunctiva/corneas clear, EOM's intact       Lungs:     Clear to auscultation bilaterally, respirations unlabored  Heart:  Normal heart rate. Irregularly irregular rhythm. No murmurs, rubs, or gallops.    MS:   All extremities are intact.    Neurologic:   Awake, alert, oriented x 3. No apparent focal neurological defect.          Assessment & Plan          Essential hypertension Blood pressure well-controlled with current medication.  Prediabetes Dietary habits good. - Order blood work to monitor glucose levels.  Hyperlipidemia - No discussion on lipid management this visit.   Atrial fibrillation - Rate well controlled. ON DOAC. Followed by cardiology at least annually  General Health Maintenance Discussed importance of vaccinations. He has not received  flu shot and is considering RSV vaccine. - Discuss RSV vaccine with wife and consider administration if desired.          Nancyann Perry, MD  [facility] [phone] (phone) [phone] (fax)  [facility]

## 2024-03-24 NOTE — Patient Instructions (Signed)
Please review the attached list of medications and notify my office if there are any errors.   You are due for a Tdap (tetanus-diptheria-pertussis vaccine) which protects you from tetanus and whooping cough. Please check with your insurance plan or pharmacy regarding coverage for this vaccine.   

## 2024-03-25 ENCOUNTER — Ambulatory Visit: Payer: Self-pay | Admitting: Family Medicine

## 2024-03-25 LAB — COMPREHENSIVE METABOLIC PANEL WITH GFR
ALT: 23 IU/L (ref 0–44)
AST: 21 IU/L (ref 0–40)
Albumin: 4.3 g/dL (ref 3.8–4.8)
Alkaline Phosphatase: 106 IU/L (ref 47–123)
BUN/Creatinine Ratio: 15 (ref 10–24)
BUN: 15 mg/dL (ref 8–27)
Bilirubin Total: 0.5 mg/dL (ref 0.0–1.2)
CO2: 21 mmol/L (ref 20–29)
Calcium: 9.1 mg/dL (ref 8.6–10.2)
Chloride: 103 mmol/L (ref 96–106)
Creatinine, Ser: 0.99 mg/dL (ref 0.76–1.27)
Globulin, Total: 2.3 g/dL (ref 1.5–4.5)
Glucose: 199 mg/dL — ABNORMAL HIGH (ref 70–99)
Potassium: 4 mmol/L (ref 3.5–5.2)
Sodium: 140 mmol/L (ref 134–144)
Total Protein: 6.6 g/dL (ref 6.0–8.5)
eGFR: 79 mL/min/1.73 (ref >59)

## 2024-03-25 LAB — LIPID PANEL
Chol/HDL Ratio: 3 ratio (ref 0.0–5.0)
Cholesterol, Total: 157 mg/dL (ref 100–199)
HDL: 52 mg/dL (ref >39)
LDL Chol Calc (NIH): 86 mg/dL (ref 0–99)
Triglycerides: 101 mg/dL (ref 0–149)
VLDL Cholesterol Cal: 19 mg/dL (ref 5–40)

## 2024-03-25 LAB — HEMOGLOBIN A1C
Est. average glucose Bld gHb Est-mCnc: 137 mg/dL
Hgb A1c MFr Bld: 6.4 % — ABNORMAL HIGH (ref 4.8–5.6)

## 2024-03-25 LAB — CBC
Hematocrit: 44.1 % (ref 37.5–51.0)
Hemoglobin: 15 g/dL (ref 13.0–17.7)
MCH: 31.3 pg (ref 26.6–33.0)
MCHC: 34 g/dL (ref 31.5–35.7)
MCV: 92 fL (ref 79–97)
Platelets: 255 x10E3/uL (ref 150–450)
RBC: 4.8 x10E6/uL (ref 4.14–5.80)
RDW: 12.5 % (ref 11.6–15.4)
WBC: 5.3 x10E3/uL (ref 3.4–10.8)

## 2024-03-25 LAB — FPSA% REFLEX
% FREE PSA: 24 %
PSA, FREE: 1.13 ng/mL

## 2024-03-25 LAB — PSA TOTAL (REFLEX TO FREE): Prostate Specific Ag, Serum: 4.7 ng/mL — ABNORMAL HIGH (ref 0.0–4.0)

## 2024-03-25 LAB — TSH: TSH: 0.718 u[IU]/mL (ref 0.450–4.500)

## 2024-06-19 ENCOUNTER — Encounter: Payer: Self-pay | Admitting: Family Medicine

## 2024-09-23 ENCOUNTER — Ambulatory Visit
